# Patient Record
Sex: Male | Born: 1980 | Race: White | Hispanic: No | Marital: Married | Smoking: Never smoker
Health system: Southern US, Community
[De-identification: ages and names within clinical notes are randomized; demographics above are authoritative.]

## PROBLEM LIST (undated history)

## (undated) DIAGNOSIS — E119 Type 2 diabetes mellitus without complications: Secondary | ICD-10-CM

## (undated) DIAGNOSIS — J45909 Unspecified asthma, uncomplicated: Secondary | ICD-10-CM

## (undated) HISTORY — PX: ESOPHAGOGASTRODUODENOSCOPY: SHX1529

## (undated) HISTORY — PX: INCISION AND DRAINAGE ABSCESS ANAL: SUR669

## (undated) HISTORY — PX: TONSILLECTOMY AND ADENOIDECTOMY: SHX28

## (undated) HISTORY — PX: TONSILLECTOMY: SUR1361

## (undated) HISTORY — DX: Type 2 diabetes mellitus without complications: E11.9

---

## 2009-04-29 ENCOUNTER — Observation Stay (HOSPITAL_COMMUNITY): Admission: EM | Admit: 2009-04-29 | Discharge: 2009-04-29 | Payer: Self-pay | Admitting: Emergency Medicine

## 2011-05-14 NOTE — Op Note (Signed)
NAME:  Jimmy Hancock, Jimmy Hancock NO.:  [id number]   MEDICAL RECORD NO.:  [id number]          PATIENT TYPE:  OBV   LOCATION:  1825                         FACILITY:  [facility]   PHYSICIAN:  Graylin Shiver, M.D.   DATE OF BIRTH:  August 01, 1981   DATE OF PROCEDURE:  04/29/2009  DATE OF DISCHARGE:  04/29/2009                               OPERATIVE REPORT   PROCEDURE:  Esophagogastroduodenoscopy with foreign body (meat impaction  removed from the esophagus).   INDICATIONS FOR PROCEDURE:  The patient is a 30 year old male who is an  unassigned patient who presented to the emergency room at [facility] with complaints of being unable to swallow.  He had been eating  stake earlier in the day and has been unable to swallow since he ate the  stake.   We were on unassigned call for [facility] and therefore we were called.   Informed consent was obtained after explanation of the risks of  bleeding, infection, and perforation.   PREMEDICATIONS:  Fentanyl 50 mcg IV and Versed 8 mg IV.   PROCEDURE:  With the patient in the left lateral decubitus position, the  Pentax gastroscope was inserted into the oropharynx and passed into the  esophagus.  It was advanced down the esophagus to the level of the  esophagogastric junction where a round meat impaction was visible.  This  meat impaction was grasped with a [employer] and removed.  After I removed  this, I reinserted the scope.  There was irritation around the level of  the esophagogastric junction where this meat impaction had been stuck,  but no definite stricture.  The scope was advanced down into the stomach  and into the duodenum, which looked normal.  He tolerated this procedure  well without complications.   IMPRESSION:  Meat impaction in the distal esophagus, which was removed.   The patient was returned to the recovery area.  He was instructed to  chew his food well and to avoid chunks of meat.  He is instructed to  stay on  liquids and soft foods for a day and see how he did.           ______________________________  Graylin Shiver, M.D.     SFG/MEDQ  D:  04/29/2009  T:  04/30/2009  Job:  [id number]

## 2017-09-10 ENCOUNTER — Encounter (HOSPITAL_COMMUNITY): Payer: Self-pay | Admitting: Emergency Medicine

## 2017-09-10 ENCOUNTER — Emergency Department (HOSPITAL_COMMUNITY)
Admission: EM | Admit: 2017-09-10 | Discharge: 2017-09-10 | Disposition: A | Discharge status: Home / Self Care | Payer: Managed Care, Other (non HMO) | Attending: Emergency Medicine | Admitting: Emergency Medicine

## 2017-09-10 DIAGNOSIS — R1310 Dysphagia, unspecified: Secondary | ICD-10-CM | POA: Diagnosis present

## 2017-09-10 DIAGNOSIS — R1319 Other dysphagia: Secondary | ICD-10-CM | POA: Diagnosis not present

## 2017-09-10 MED ORDER — DIPHENHYDRAMINE HCL 25 MG PO CAPS
25.0000 mg | ORAL_CAPSULE | Freq: Once | ORAL | Status: AC
  Administered 2017-09-10: 25 mg via ORAL
  Filled 2017-09-10: qty 1

## 2017-09-10 NOTE — ED Triage Notes (Signed)
Pt states he has some throat swelling after eating scallops and tuna tonight.

## 2017-09-10 NOTE — ED Provider Notes (Signed)
[facility] DEPT Provider Note   CSN: [id number] Arrival date & time: 09/10/17  0456     History   Chief Complaint Chief Complaint  Patient presents with  . Allergic Reaction    HPI Jimmy Hancock is a 36 y.o. male.  The history is provided by the patient and a significant other.  Allergic Reaction  Presenting symptoms: difficulty swallowing   Presenting symptoms: no rash   Severity:  Mild Relieved by:  Nothing Worsened by:  Nothing Ineffective treatments:  None tried  Pt reports abrupt onset of difficulty swallowing He reports eating rice/tuna/scallops just prior to arrival After eating he felt something was "stuck" in his esophagus No other symptoms No fever/vomiting No cp/sob No rash No abd pain  He also thinks it could be possible food impaction He reports h/o food impaction several yrs ago due to steak and it feels somewhat similar He reports he has ate tuna/scallops/rice before without any issues   PMH -none Past Surgical History:  Procedure Laterality Date  . ESOPHAGOGASTRODUODENOSCOPY         Home Medications    Prior to Admission medications   Medication Sig Start Date End Date Taking? Authorizing Provider  ibuprofen (ADVIL,MOTRIN) 200 MG tablet Take 200 mg by mouth every 6 (six) hours as needed.   Yes [provider]    Family History No family history on file.  Social History Social History  Substance Use Topics  . Smoking status: Never Smoker  . Smokeless tobacco: Never Used  . Alcohol use No     Allergies   Soap   Review of Systems Review of Systems  Constitutional: Negative for fever.  HENT: Positive for trouble swallowing. Negative for voice change.   Respiratory: Negative for shortness of breath.   Gastrointestinal: Negative for vomiting.  Skin: Negative for rash.  All other systems reviewed and are negative.    Physical Exam Updated Vital Signs BP (!) 142/100 (BP Location: Left Arm)   Pulse 87   Temp  98 F (36.7 C) (Oral)   Resp 18   Ht 1.778 m ( )   Wt 83.9 kg (185 lb)   SpO2 98%   BMI 26.54 kg/m   Physical Exam CONSTITUTIONAL: Well developed/well nourished, smiling, well appearing HEAD: Normocephalic/atraumatic EYES: EOMI/PERRL ENMT: Mucous membranes moist, no angioedema.  No stridor.  Normal phonation.  No tongue swelling noted.  Uvula midline, no edema.  He is handling his secretions, no drooling NECK: supple no meningeal signs SPINE/BACK:entire spine nontender CV: S1/S2 noted, no murmurs/rubs/gallops noted LUNGS: Lungs are clear to auscultation bilaterally, no apparent distress ABDOMEN: soft, nontender, no rebound or guarding, bowel sounds noted throughout abdomen GU:no cva tenderness NEURO: Pt is awake/alert/appropriate, moves all extremitiesx4. EXTREMITIES: pulses normal/equal, full ROM SKIN: warm, color normal, no rash PSYCH: no abnormalities of mood noted, alert and oriented to situation   ED Treatments / Results  Labs (all labs ordered are listed, but only abnormal results are displayed) Labs Reviewed - No data to display  EKG  EKG Interpretation None       Radiology No results found.  Procedures Procedures (including critical care time)  Medications Ordered in ED Medications  diphenhydrAMINE (BENADRYL) capsule 25 mg (25 mg Oral Given 09/10/17 0523)     Initial Impression / Assessment and Plan / ED Course  I have reviewed the triage vital signs and the nursing notes.   Pt well appearing At first he thought this was allergic rxn, but he now thinks it  could be mild food impaction H/o steak food impaction that required EGD in 2010 Since he has had this food before and has no other signs of allergic rxn, probable food impaction He would however like to try benadryl Will also give PO challenge for now Will monitor   At time of discharge: Pt improved He reports his swallowing has improved No signs of allergic reaction Suspect mild food  impaction that is resolving Will d/c home   Final Clinical Impressions(s) / ED Diagnoses   Final diagnoses:  Other dysphagia    New Prescriptions New Prescriptions   No medications on file     Zadie RhineWickline, Mackensey Bolte, MD 09/10/17 [id number]

## 2018-10-21 ENCOUNTER — Ambulatory Visit (INDEPENDENT_AMBULATORY_CARE_PROVIDER_SITE_OTHER): Payer: Managed Care, Other (non HMO) | Admitting: Family Medicine

## 2018-10-21 ENCOUNTER — Encounter: Payer: Self-pay | Admitting: Family Medicine

## 2018-10-21 ENCOUNTER — Other Ambulatory Visit: Payer: Self-pay

## 2018-10-21 VITALS — BP 120/78 | HR 82 | Temp 98.2°F | Ht 68.75 in | Wt 189.8 lb

## 2018-10-21 DIAGNOSIS — M25561 Pain in right knee: Secondary | ICD-10-CM

## 2018-10-21 DIAGNOSIS — M19042 Primary osteoarthritis, left hand: Secondary | ICD-10-CM

## 2018-10-21 DIAGNOSIS — Z23 Encounter for immunization: Secondary | ICD-10-CM

## 2018-10-21 DIAGNOSIS — M19041 Primary osteoarthritis, right hand: Secondary | ICD-10-CM | POA: Diagnosis not present

## 2018-10-21 DIAGNOSIS — Z0000 Encounter for general adult medical examination without abnormal findings: Secondary | ICD-10-CM

## 2018-10-21 DIAGNOSIS — J309 Allergic rhinitis, unspecified: Secondary | ICD-10-CM

## 2018-10-21 LAB — COMPREHENSIVE METABOLIC PANEL
ALBUMIN: 5 g/dL (ref 3.5–5.2)
ALT: 60 U/L — ABNORMAL HIGH (ref 0–53)
AST: 30 U/L (ref 0–37)
Alkaline Phosphatase: 111 U/L (ref 39–117)
BUN: 20 mg/dL (ref 6–23)
CALCIUM: 9.7 mg/dL (ref 8.4–10.5)
CHLORIDE: 100 meq/L (ref 96–112)
CO2: 29 mEq/L (ref 19–32)
Creatinine, Ser: 1.46 mg/dL (ref 0.40–1.50)
GFR: 58.1 mL/min — ABNORMAL LOW (ref >60.00)
Glucose, Bld: 120 mg/dL — ABNORMAL HIGH (ref 70–99)
Potassium: 4.2 mEq/L (ref 3.5–5.1)
Sodium: 136 mEq/L (ref 135–145)
Total Bilirubin: 0.6 mg/dL (ref 0.2–1.2)
Total Protein: 7.5 g/dL (ref 6.0–8.3)

## 2018-10-21 LAB — CBC WITH DIFFERENTIAL/PLATELET
BASOS PCT: 0.9 % (ref 0.0–3.0)
Basophils Absolute: 0.1 10*3/uL (ref 0.0–0.1)
Eosinophils Absolute: 0.3 10*3/uL (ref 0.0–0.7)
Eosinophils Relative: 5.3 % — ABNORMAL HIGH (ref 0.0–5.0)
HCT: 47.3 % (ref 39.0–52.0)
HEMOGLOBIN: 16.5 g/dL (ref 13.0–17.0)
LYMPHS PCT: 20.5 % (ref 12.0–46.0)
Lymphs Abs: 1.3 10*3/uL (ref 0.7–4.0)
MCHC: 34.8 g/dL (ref 30.0–36.0)
MCV: 87.3 fl (ref 78.0–100.0)
MONO ABS: 0.5 10*3/uL (ref 0.1–1.0)
Monocytes Relative: 7.3 % (ref 3.0–12.0)
NEUTROS ABS: 4.1 10*3/uL (ref 1.4–7.7)
Neutrophils Relative %: 66 % (ref 43.0–77.0)
PLATELETS: 271 10*3/uL (ref 150.0–400.0)
RBC: 5.41 Mil/uL (ref 4.22–5.81)
RDW: 12.6 % (ref 11.5–15.5)
WBC: 6.2 10*3/uL (ref 4.0–10.5)

## 2018-10-21 LAB — LDL CHOLESTEROL, DIRECT: LDL DIRECT: 155 mg/dL

## 2018-10-21 LAB — LIPID PANEL
CHOL/HDL RATIO: 6
CHOLESTEROL: 217 mg/dL — AB (ref 0–200)
HDL: 33.7 mg/dL — AB (ref >39.00)
NONHDL: 183.28
TRIGLYCERIDES: 307 mg/dL — AB (ref 0.0–149.0)
VLDL: 61.4 mg/dL — AB (ref 0.0–40.0)

## 2018-10-21 MED ORDER — MOMETASONE FUROATE 50 MCG/ACT NA SUSP: 2.0000 | Freq: Every day | NASAL | 12 refills | Status: DC

## 2018-10-21 NOTE — Addendum Note (Signed)
Addended byDene Gentry on: 10/21/2018 09:51 AM   Modules accepted: Orders

## 2018-10-21 NOTE — Patient Instructions (Addendum)
Please return in 12 months for your annual complete physical; please come fasting. Sooner if your knee or hand pain recur.   Start allergy medications: Allegra   It was a pleasure meeting you today! Thank you for choosing [place] to meet your healthcare needs! I truly look forward to working with you. If you have any questions or concerns, please send me a message via Mychart or call the office at [phone].  Allergic Rhinitis, Adult Allergic rhinitis is an allergic reaction that affects the mucous membrane inside the nose. It causes sneezing, a runny or stuffy nose, and the feeling of mucus going down the back of the throat (postnasal drip). Allergic rhinitis can be mild to severe. There are two types of allergic rhinitis:  Seasonal. This type is also called hay fever. It happens only during certain seasons.  Perennial. This type can happen at any time of the year.  What are the causes? This condition happens when the body's defense system (immune system) responds to certain harmless substances called allergens as though they were germs.  Seasonal allergic rhinitis is triggered by pollen, which can come from grasses, trees, and weeds. Perennial allergic rhinitis may be caused by:  House dust mites.  Pet dander.  Mold spores.  What are the signs or symptoms? Symptoms of this condition include:  Sneezing.  Runny or stuffy nose (nasal congestion).  Postnasal drip.  Itchy nose.  Tearing of the eyes.  Trouble sleeping.  Daytime sleepiness.  How is this diagnosed? This condition may be diagnosed based on:  Your medical history.  A physical exam.  Tests to check for related conditions, such as: ? Asthma. ? Pink eye. ? Ear infection. ? Upper respiratory infection.  Tests to find out which allergens trigger your symptoms. These may include skin or blood tests.  How is this treated? There is no cure for this condition, but treatment can help control symptoms. Treatment  may include:  Taking medicines that block allergy symptoms, such as antihistamines. Medicine may be given as a shot, nasal spray, or pill.  Avoiding the allergen.  Desensitization. This treatment involves getting ongoing shots until your body becomes less sensitive to the allergen. This treatment may be done if other treatments do not help.  If taking medicine and avoiding the allergen does not work, new, stronger medicines may be prescribed.  Follow these instructions at home:  Find out what you are allergic to. Common allergens include smoke, dust, and pollen.  Avoid the things you are allergic to. These are some things you can do to help avoid allergens: ? Replace carpet with wood, tile, or vinyl flooring. Carpet can trap dander and dust. ? Do not smoke. Do not allow smoking in your home. ? Change your heating and air conditioning filter at least once a month. ? During allergy season:  Keep windows closed as much as possible.  Plan outdoor activities when pollen counts are lowest. This is usually during the evening hours.  When coming indoors, change clothing and shower before sitting on furniture or bedding.  Take over-the-counter and prescription medicines only as told by your health care provider.  Keep all follow-up visits as told by your health care provider. This is important. Contact a health care provider if:  You have a fever.  You develop a persistent cough.  You make whistling sounds when you breathe (you wheeze).  Your symptoms interfere with your normal daily activities. Get help right away if:  You have shortness of breath.  Summary  This condition can be managed by taking medicines as directed and avoiding allergens.  Contact your health care provider if you develop a persistent cough or fever.  During allergy season, keep windows closed as much as possible. This information is not intended to replace advice given to you by your health care provider.  Make sure you discuss any questions you have with your health care provider. Document Released: 09/10/2001 Document Revised: 01/23/2017 Document Reviewed: 01/23/2017 Elsevier Interactive Patient Education  [employer].

## 2018-10-21 NOTE — Progress Notes (Signed)
Subjective  CC:  Chief Complaint  Patient presents with  . Establish Care    Transfer from [facility], last physcial 3-4 years ago   . Knee Problem    on and off pain, more when working   . Cough    Patient states that he has have drainage, where he has to cough up Phelgm, 3-4 times per day     HPI: Jimmy Hancock is a 37 y.o. male who presents to [facility] at [facility] today to establish care with me as a new patient.   He has the following concerns or needs:  2-3 week history of right knee pain. Now resolved spontaneously. Ibuprofen helped. No injury. No h/o knee problems.   Hand pain: joints w/o redness or swelling. Works as Conservation officer, nature. No injuries. Comes and goes.   2 years of pnd and clearing throat. No cough. No sob. Nonsmoker. Has failed flonase, patanase and zyrtec.   HM: due labs and imms  Assessment  1. Annual physical exam   2. Primary osteoarthritis of both hands   3. Acute pain of right knee   4. Chronic allergic rhinitis      Plan   Wellness Visit:  Age appropriate Health Maintenance and Prevention measures were discussed with patient. Included topics are cancer screening recommendations, ways to keep healthy (see AVS) including dietary and exercise recommendations, regular eye and dental care, use of seat belts, and avoidance of moderate alcohol use and tobacco use.   BMI: discussed patient's BMI and encouraged positive lifestyle modifications to help get to or maintain a target BMI.  HM needs and immunizations were addressed and ordered. See below for orders. See HM and immunization section for updates.tdap and flu today  Routine labs and screening tests ordered including cmp, cbc and lipids where appropriate.  Discussed recommendations regarding Vit D and calcium supplementation (see AVS)  Hand and knee pain: ? Early OA vs tendonitis in hands. Tylenol or advil as needed. Since not currently active, no further eval  today. F/u here if worsens.   Allergies: start meds.     Follow up:  Return in about 1 year (around 10/22/2019) for complete physical. Orders Placed This Encounter  Procedures  . CBC with Differential/Platelet  . Comprehensive metabolic panel  . Lipid panel  . HIV Antibody (routine testing w rflx)   Meds ordered this encounter  Medications  . mometasone (NASONEX) 50 MCG/ACT nasal spray    Sig: Place 2 sprays into the nose daily.    Dispense:  17 g    Refill:  12     Depression screen PHQ 2/9 10/21/2018  Decreased Interest 0  Down, Depressed, Hopeless 0  PHQ - 2 Score 0    We updated and reviewed the patient's past history in detail and it is documented below.  Patient Active Problem List   Diagnosis Date Noted  . Annual physical exam 10/21/2018  . Chronic allergic rhinitis 10/21/2018  . Primary osteoarthritis of both hands 10/21/2018   Health Maintenance  Topic Date Due  . HIV Screening  11/29/1997  . TETANUS/TDAP  11/29/2001  . INFLUENZA VACCINE  07/30/2018    There is no immunization history on file for this patient. No outpatient medications have been marked as taking for the 10/21/18 encounter (Office Visit) with Willow Ora, MD.    Allergies: Patient is allergic to soap. Past Medical History Patient  has no past medical history on file. Past Surgical History  Patient  has a past surgical history that includes Esophagogastroduodenoscopy and Tonsillectomy and adenoidectomy. Family History: Patient family history is not on file. Social History:  Patient  reports that he has never smoked. He has never used smokeless tobacco. He reports that he drinks alcohol. He reports that he does not use drugs.  Review of Systems: Constitutional: negative for fever or malaise Ophthalmic: negative for photophobia, double vision or loss of vision Cardiovascular: negative for chest pain, dyspnea on exertion, or new LE swelling Respiratory: negative for SOB or  persistent cough Gastrointestinal: negative for abdominal pain, change in bowel habits or melena Genitourinary: negative for dysuria or gross hematuria Musculoskeletal: negative for new gait disturbance or muscular weakness Integumentary: negative for new or persistent rashes Neurological: negative for TIA or stroke symptoms Psychiatric: negative for SI or delusions Allergic/Immunologic: negative for hives  Patient Care Team    Relationship Specialty Notifications Start End  Willow Ora, MD PCP - General Family Medicine  10/21/18     Objective  Vitals: BP 120/78   Pulse 82   Temp 98.2 F (36.8 C)   Ht 5' 8.75" (1.746 m)   Wt 189 lb 12.8 oz (86.1 kg)   BMI 28.23 kg/m  General:  Well developed, well nourished, no acute distress , clears throat often Psych:  Alert and oriented,normal mood and affect HEENT:  Normocephalic, atraumatic, non-icteric sclera, PERRL,inflamed nasal mucosa, nl TMs, oropharynx is without mass or exudate, supple neck without adenopathy, mass or thyromegaly Cardiovascular:  RRR without gallop, rub or murmur, nondisplaced PMI Respiratory:  Good breath sounds bilaterally, CTAB with normal respiratory effort Gastrointestinal: normal bowel sounds, soft, non-tender, no noted masses. No HSM MSK: no deformities, contusions. Joints are without erythema or swelling, nontender joints, nl bilateral knee exam, popping with deep squat. No crepitus.  Skin:  Warm, no rashes or suspicious lesions noted Neurologic:    Mental status is normal. Gross motor and sensory exams are normal. Normal gait   Commons side effects, risks, benefits, and alternatives for medications and treatment plan prescribed today were discussed, and the patient expressed understanding of the given instructions. Patient is instructed to call or message via MyChart if he/she has any questions or concerns regarding our treatment plan. No barriers to understanding were identified. We discussed Red Flag  symptoms and signs in detail. Patient expressed understanding regarding what to do in case of urgent or emergency type symptoms.   Medication list was reconciled, printed and provided to the patient in AVS. Patient instructions and summary information was reviewed with the patient as documented in the AVS. This note was prepared with assistance of Dragon voice recognition software. Occasional wrong-word or sound-a-like substitutions may have occurred due to the inherent limitations of voice recognition software

## 2018-10-22 ENCOUNTER — Other Ambulatory Visit (INDEPENDENT_AMBULATORY_CARE_PROVIDER_SITE_OTHER): Payer: Managed Care, Other (non HMO)

## 2018-10-22 DIAGNOSIS — R7309 Other abnormal glucose: Secondary | ICD-10-CM

## 2018-10-22 LAB — HEMOGLOBIN A1C: Hgb A1c MFr Bld: 6.2 % (ref 4.6–6.5)

## 2018-10-22 LAB — HIV ANTIBODY (ROUTINE TESTING W REFLEX): HIV 1&2 Ab, 4th Generation: NONREACTIVE

## 2018-10-22 NOTE — Progress Notes (Signed)
Please add on A1c, dx: hyperglycemia (nonfasting) Thanks, Dr. Mardelle Matte '

## 2019-01-25 ENCOUNTER — Ambulatory Visit: Payer: Managed Care, Other (non HMO) | Admitting: Family Medicine

## 2019-10-25 ENCOUNTER — Encounter: Payer: Managed Care, Other (non HMO) | Admitting: Family Medicine

## 2020-04-23 ENCOUNTER — Encounter: Payer: Self-pay | Admitting: Emergency Medicine

## 2020-04-23 ENCOUNTER — Other Ambulatory Visit: Payer: Self-pay

## 2020-04-23 ENCOUNTER — Ambulatory Visit
Admission: EM | Admit: 2020-04-23 | Discharge: 2020-04-23 | Disposition: A | Discharge status: Home / Self Care | Payer: Managed Care, Other (non HMO) | Attending: Emergency Medicine | Admitting: Emergency Medicine

## 2020-04-23 DIAGNOSIS — L0231 Cutaneous abscess of buttock: Secondary | ICD-10-CM | POA: Diagnosis not present

## 2020-04-23 HISTORY — DX: Unspecified asthma, uncomplicated: J45.909

## 2020-04-23 MED ORDER — CEPHALEXIN 500 MG PO CAPS: 500.0000 mg | ORAL_CAPSULE | Freq: Four times a day (QID) | ORAL | 0 refills | Status: DC

## 2020-04-23 NOTE — Discharge Instructions (Addendum)
Apply warm compresses 3-4x daily for 10-15 minutes Wash site daily with warm water and mild soap Take antibiotic as prescribed and to completion Follow up here or with PCP if symptoms persists Return or go to the ED if you have any new or worsening symptoms increased redness, swelling, pain, nausea, vomiting, fever, chills, etc...  

## 2020-04-23 NOTE — ED Provider Notes (Signed)
[facility]   [id number] 04/23/20 Arrival Time: 5956   LO:VFIEPPI  SUBJECTIVE:  Jimmy Hancock is a 38 y.o. male who presents presented to the urgent care with a complaint of abscess on his right buttocks for a few days.  Denies drainage at this time the reported redness.  Denies any precipitating event.  Has not used any medication.  Nothing make his symptoms worse.  Denies chills, fever, nausea, vomiting, diarrhea, chest pain, chest tightness, confusion.  ROS: As per HPI.  All other pertinent ROS negative.     Past Medical History:  Diagnosis Date  . Asthma    Past Surgical History:  Procedure Laterality Date  . ESOPHAGOGASTRODUODENOSCOPY    . TONSILLECTOMY AND ADENOIDECTOMY     Allergies  Allergen Reactions  . Soap    No current facility-administered medications on file prior to encounter.   Current Outpatient Medications on File Prior to Encounter  Medication Sig Dispense Refill  . mometasone (NASONEX) 50 MCG/ACT nasal spray Place 2 sprays into the nose daily. 17 g 12   Social History   Socioeconomic History  . Marital status: Married    Spouse name: Not on file  . Number of children: 0  . Years of education: Not on file  . Highest education level: Not on file  Occupational History  . Occupation: Scientist, water quality, night shift    Employer: [employer]  Tobacco Use  . Smoking status: Never Smoker  . Smokeless tobacco: Never Used  Substance and Sexual Activity  . Alcohol use: Yes    Comment: occasionally   . Drug use: No  . Sexual activity: Yes  Other Topics Concern  . Not on file  Social History Narrative  . Not on file   Social Determinants of Health   Financial Resource Strain:   . Difficulty of Paying Living Expenses:   Food Insecurity:   . Worried About Charity fundraiser in the Last Year:   . Arboriculturist in the Last Year:   Transportation Needs:   . Film/video editor (Medical):   Marland Kitchen Lack of Transportation (Non-Medical):     Physical Activity:   . Days of Exercise per Week:   . Minutes of Exercise per Session:   Stress:   . Feeling of Stress :   Social Connections:   . Frequency of Communication with Friends and Family:   . Frequency of Social Gatherings with Friends and Family:   . Attends Religious Services:   . Active Member of Clubs or Organizations:   . Attends Archivist Meetings:   Marland Kitchen Marital Status:   Intimate Partner Violence:   . Fear of Current or Ex-Partner:   . Emotionally Abused:   Marland Kitchen Physically Abused:   . Sexually Abused:    Family History  Problem Relation Age of Onset  . Arthritis Mother   . Hearing loss Maternal Grandfather   . Heart attack Maternal Grandfather     OBJECTIVE:  Vitals:   04/23/20 0847  BP: (!) 173/98  Pulse: (!) 106  Resp: 18  Temp: 98.7 F (37.1 C)  TempSrc: Oral  SpO2: 98%     Physical Exam Vitals reviewed.  Constitutional:      General: He is not in acute distress.    Appearance: Normal appearance. He is normal weight. He is not ill-appearing, toxic-appearing or diaphoretic.  Cardiovascular:     Rate and Rhythm: Normal rate and regular rhythm.     Pulses: Normal pulses.  Heart sounds: Normal heart sounds. No murmur. No friction rub. No gallop.   Pulmonary:     Effort: Pulmonary effort is normal. No respiratory distress.     Breath sounds: Normal breath sounds. No stridor. No wheezing, rhonchi or rales.  Chest:     Chest wall: No tenderness.  Skin:    General: Skin is warm.     Findings: Erythema present. No rash.     Comments: Abscess with 0.5 cm induration, hard and tender to touch.  Neurological:     Mental Status: He is alert.     Procedure: Patient requesting conservative measures first.  I&D was not completed at this visit.  ASSESSMENT & PLAN:  1. Abscess of buttock     Meds ordered this encounter  Medications  . cephALEXin (KEFLEX) 500 MG capsule    Sig: Take 1 capsule (500 mg total) by mouth 4 (four) times  daily.    Dispense:  20 capsule    Refill:  0    Discharge instruction Apply warm compresses 3-4x daily for 10-15 minutes Wash site daily with warm water and mild soap Take antibiotic as prescribed and to completion Follow up here or with PCP if symptoms persists Return or go to the ED if you have any new or worsening symptoms increased redness, swelling, pain, nausea, vomiting, fever, chills, etc...    Reviewed expectations re: course of current medical issues. Questions answered. Outlined signs and symptoms indicating need for more acute intervention. Patient verbalized understanding. After Visit Summary given.          Durward Parcel, FNP 04/23/20 [phone]

## 2020-04-23 NOTE — ED Triage Notes (Signed)
Pt here for abscess to right buttocks with hx of same in past; pt denies drainage

## 2020-04-27 ENCOUNTER — Encounter: Payer: Self-pay | Admitting: Emergency Medicine

## 2020-04-27 ENCOUNTER — Ambulatory Visit
Admission: EM | Admit: 2020-04-27 | Discharge: 2020-04-27 | Disposition: A | Discharge status: Home / Self Care | Payer: Managed Care, Other (non HMO) | Attending: Emergency Medicine | Admitting: Emergency Medicine

## 2020-04-27 ENCOUNTER — Other Ambulatory Visit: Payer: Self-pay

## 2020-04-27 DIAGNOSIS — L0231 Cutaneous abscess of buttock: Secondary | ICD-10-CM | POA: Diagnosis not present

## 2020-04-27 MED ORDER — DOXYCYCLINE HYCLATE 100 MG PO CAPS: 100.0000 mg | ORAL_CAPSULE | Freq: Two times a day (BID) | ORAL | 0 refills | Status: DC

## 2020-04-27 NOTE — ED Triage Notes (Addendum)
patient was seen 04/23/2020 for abscess on right buttocks.  Patient reports this is not feeling any better, wife reports its larger, patient thinks it is softer

## 2020-04-27 NOTE — ED Provider Notes (Addendum)
[facility]   [id number] 04/27/20 Arrival Time: 0823   [id number]  SUBJECTIVE:  Jimmy Hancock is a 39 y.o. male who presented to the urgent care with a complaint of abscess on his right buttocks for a few days.  Denies drainage at this time but report redness.  Was seen on 04/23/2020 and was prescribed Keflex.  Has completed the antibiotic course with no relief.  Report symptom is worse when trying to sit down.  Denies chills, fever, nausea, vomiting, diarrhea. ROS: As per HPI.  All other pertinent ROS negative.     Past Medical History:  Diagnosis Date  . Asthma    Past Surgical History:  Procedure Laterality Date  . ESOPHAGOGASTRODUODENOSCOPY    . TONSILLECTOMY    . TONSILLECTOMY AND ADENOIDECTOMY     Allergies  Allergen Reactions  . Soap    No current facility-administered medications on file prior to encounter.   Current Outpatient Medications on File Prior to Encounter  Medication Sig Dispense Refill  . cephALEXin (KEFLEX) 500 MG capsule Take 1 capsule (500 mg total) by mouth 4 (four) times daily. 20 capsule 0  . mometasone (NASONEX) 50 MCG/ACT nasal spray Place 2 sprays into the nose daily. 17 g 12   Social History   Socioeconomic History  . Marital status: Married    Spouse name: Not on file  . Number of children: 0  . Years of education: Not on file  . Highest education level: Not on file  Occupational History  . Occupation: Scientist, water quality, night shift    Employer: [employer]  Tobacco Use  . Smoking status: Never Smoker  . Smokeless tobacco: Never Used  Substance and Sexual Activity  . Alcohol use: Yes    Comment: occasionally   . Drug use: No  . Sexual activity: Yes  Other Topics Concern  . Not on file  Social History Narrative  . Not on file   Social Determinants of Health   Financial Resource Strain:   . Difficulty of Paying Living Expenses:   Food Insecurity:   . Worried About Charity fundraiser in the Last Year:   . Youth worker in the Last Year:   Transportation Needs:   . Film/video editor (Medical):   Marland Kitchen Lack of Transportation (Non-Medical):   Physical Activity:   . Days of Exercise per Week:   . Minutes of Exercise per Session:   Stress:   . Feeling of Stress :   Social Connections:   . Frequency of Communication with Friends and Family:   . Frequency of Social Gatherings with Friends and Family:   . Attends Religious Services:   . Active Member of Clubs or Organizations:   . Attends Archivist Meetings:   Marland Kitchen Marital Status:   Intimate Partner Violence:   . Fear of Current or Ex-Partner:   . Emotionally Abused:   Marland Kitchen Physically Abused:   . Sexually Abused:    Family History  Problem Relation Age of Onset  . Arthritis Mother   . Hearing loss Maternal Grandfather   . Heart attack Maternal Grandfather     OBJECTIVE:  Vitals:   04/27/20 0833  BP: (!) 148/102  Pulse: (!) 103  Resp: 20  Temp: 99.1 F (37.3 C)  TempSrc: Oral  SpO2: 96%    Physical Exam Vitals and nursing note reviewed.  Constitutional:      General: He is not in acute distress.    Appearance: Normal appearance.  He is normal weight. He is not ill-appearing, toxic-appearing or diaphoretic.  Cardiovascular:     Rate and Rhythm: Regular rhythm. Tachycardia present.     Pulses: Normal pulses.     Heart sounds: No murmur. No friction rub. No gallop.   Pulmonary:     Effort: Pulmonary effort is normal. No respiratory distress.     Breath sounds: Normal breath sounds. No stridor. No wheezing or rales.  Chest:     Chest wall: No tenderness.  Skin:    General: Skin is warm.     Findings: Abscess and erythema present.     Comments: Abscess of right buttocks Abscess induration approximate 2 cm, erythema present.  Neurological:     Mental Status: He is alert.     Procedure: Verbal consent obtained. Area over induration cleaned with betadine. Lidocaine 2% with epinephrine used to obtain local anesthesia.  The most fluctuant portion of the abscess was incised with a #11 blade scalpel. Abscess cavity explored and evacuated. Loculations broken up with a curved hemostat as best as possible given patient discomfort. Cavity carry left open for drainage and dressed with a clean gauze dressing. Minimal bleeding. No complications.  ASSESSMENT & PLAN:  1. Abscess of right buttock     Meds ordered this encounter  Medications  . doxycycline (VIBRAMYCIN) 100 MG capsule    Sig: Take 1 capsule (100 mg total) by mouth 2 (two) times daily.    Dispense:  20 capsule    Refill:  0     Discharge instruction Keep dry and covered for next 24-48 hours May wash site daily with warm water and mild soap Keep covered to avoid friction Take antibiotic as prescribed and to completion Return sooner or go to the ED if you have any new or worsening symptoms such as increased redness, swelling, pain, nausea, vomiting, fever, chills, etc...   Reviewed expectations re: course of current medical issues. Questions answered. Outlined signs and symptoms indicating need for more acute intervention. Patient verbalized understanding. After Visit Summary given.       Durward Parcel, FNP 04/27/20 [phone]

## 2020-04-27 NOTE — Discharge Instructions (Signed)
Discharge instruction Keep dry and covered for next 24-48 hours May wash site daily with warm water and mild soap Keep covered to avoid friction Take antibiotic as prescribed and to completion Return sooner or go to the ED if you have any new or worsening symptoms such as increased redness, swelling, pain, nausea, vomiting, fever, chills, etc..Marland Kitchen

## 2020-09-22 ENCOUNTER — Other Ambulatory Visit: Payer: Self-pay

## 2020-09-22 ENCOUNTER — Ambulatory Visit
Admission: EM | Admit: 2020-09-22 | Discharge: 2020-09-22 | Disposition: A | Discharge status: Home / Self Care | Payer: Managed Care, Other (non HMO) | Attending: Emergency Medicine | Admitting: Emergency Medicine

## 2020-09-22 ENCOUNTER — Encounter: Payer: Self-pay | Admitting: Emergency Medicine

## 2020-09-22 DIAGNOSIS — J069 Acute upper respiratory infection, unspecified: Secondary | ICD-10-CM | POA: Diagnosis not present

## 2020-09-22 DIAGNOSIS — Z1152 Encounter for screening for COVID-19: Secondary | ICD-10-CM | POA: Diagnosis not present

## 2020-09-22 MED ORDER — CETIRIZINE HCL 10 MG PO TABS: 10.0000 mg | ORAL_TABLET | Freq: Every day | ORAL | 0 refills | Status: DC

## 2020-09-22 MED ORDER — FLUTICASONE PROPIONATE 50 MCG/ACT NA SUSP: 1.0000 | Freq: Every day | NASAL | 0 refills | Status: DC

## 2020-09-22 MED ORDER — BENZONATATE 100 MG PO CAPS: 100.0000 mg | ORAL_CAPSULE | Freq: Three times a day (TID) | ORAL | 0 refills | Status: DC

## 2020-09-22 MED ORDER — DEXAMETHASONE 4 MG PO TABS: 4.0000 mg | ORAL_TABLET | Freq: Every day | ORAL | 0 refills | Status: AC

## 2020-09-22 NOTE — ED Triage Notes (Addendum)
Cough and congestion that started yesterday  

## 2020-09-22 NOTE — ED Provider Notes (Signed)
[facility]   [id number] 09/22/20 Arrival Time: 6010   CC: COVID symptoms  SUBJECTIVE: History from: patient.  Jimmy Hancock is a 39 y.o. male who presents to the urgent care for complaint of cough, congestion that started yesterday.  Denies sick exposure to COVID, flu or strep.  Denies recent travel.  Has tried OTC medication without relief.  Denies aggravating factors.  Denies previous symptoms in the past.   Denies fever, chills, fatigue, sinus pain, rhinorrhea, sore throat, SOB, wheezing, chest pain, nausea, changes in bowel or bladder habits.     ROS: As per HPI.  All other pertinent ROS negative.     Past Medical History:  Diagnosis Date  . Asthma    Past Surgical History:  Procedure Laterality Date  . ESOPHAGOGASTRODUODENOSCOPY    . TONSILLECTOMY    . TONSILLECTOMY AND ADENOIDECTOMY     Allergies  Allergen Reactions  . Soap    No current facility-administered medications on file prior to encounter.   Current Outpatient Medications on File Prior to Encounter  Medication Sig Dispense Refill  . cephALEXin (KEFLEX) 500 MG capsule Take 1 capsule (500 mg total) by mouth 4 (four) times daily. 20 capsule 0  . doxycycline (VIBRAMYCIN) 100 MG capsule Take 1 capsule (100 mg total) by mouth 2 (two) times daily. 20 capsule 0  . mometasone (NASONEX) 50 MCG/ACT nasal spray Place 2 sprays into the nose daily. 17 g 12   Social History   Socioeconomic History  . Marital status: Married    Spouse name: Not on file  . Number of children: 0  . Years of education: Not on file  . Highest education level: Not on file  Occupational History  . Occupation: Conservation officer, nature, night shift    Employer: [employer]  Tobacco Use  . Smoking status: Never Smoker  . Smokeless tobacco: Never Used  Vaping Use  . Vaping Use: Never used  Substance and Sexual Activity  . Alcohol use: Yes    Comment: occasionally   . Drug use: No  . Sexual activity: Yes  Other Topics Concern  .  Not on file  Social History Narrative  . Not on file   Social Determinants of Health   Financial Resource Strain:   . Difficulty of Paying Living Expenses: Not on file  Food Insecurity:   . Worried About Programme researcher, broadcasting/film/video in the Last Year: Not on file  . Ran Out of Food in the Last Year: Not on file  Transportation Needs:   . Lack of Transportation (Medical): Not on file  . Lack of Transportation (Non-Medical): Not on file  Physical Activity:   . Days of Exercise per Week: Not on file  . Minutes of Exercise per Session: Not on file  Stress:   . Feeling of Stress : Not on file  Social Connections:   . Frequency of Communication with Friends and Family: Not on file  . Frequency of Social Gatherings with Friends and Family: Not on file  . Attends Religious Services: Not on file  . Active Member of Clubs or Organizations: Not on file  . Attends Banker Meetings: Not on file  . Marital Status: Not on file  Intimate Partner Violence:   . Fear of Current or Ex-Partner: Not on file  . Emotionally Abused: Not on file  . Physically Abused: Not on file  . Sexually Abused: Not on file   Family History  Problem Relation Age of Onset  .  Arthritis Mother   . Hearing loss Maternal Grandfather   . Heart attack Maternal Grandfather     OBJECTIVE:  Vitals:   09/22/20 0825 09/22/20 0831  BP:  133/90  Pulse:  100  Resp:  18  Temp: 97.9 F (36.6 C) 98.7 F (37.1 C)  TempSrc: Oral Oral  SpO2:  97%  Weight:  190 lb (86.2 kg)  Height:  5\' 10"  (1.778 m)     General appearance: alert; appears fatigued, but nontoxic; speaking in full sentences and tolerating own secretions HEENT: NCAT; Ears: EACs clear, TMs pearly gray; Eyes: PERRL.  EOM grossly intact. Sinuses: nontender; Nose: nares patent without rhinorrhea, Throat: oropharynx clear, tonsils non erythematous or enlarged, uvula midline  Neck: supple without LAD Lungs: unlabored respirations, symmetrical air entry;  cough: mild; no respiratory distress; CTAB Heart: regular rate and rhythm.  Radial pulses 2+ symmetrical bilaterally Skin: warm and dry Psychological: alert and cooperative; normal mood and affect  LABS:  No results found for this or any previous visit (from the past 24 hour(s)).   ASSESSMENT & PLAN:  1. URI with cough and congestion   2. Encounter for screening for COVID-19     Meds ordered this encounter  Medications  . fluticasone (FLONASE) 50 MCG/ACT nasal spray    Sig: Place 1 spray into both nostrils daily for 14 days.    Dispense:  16 g    Refill:  0  . benzonatate (TESSALON) 100 MG capsule    Sig: Take 1 capsule (100 mg total) by mouth every 8 (eight) hours.    Dispense:  30 capsule    Refill:  0  . dexamethasone (DECADRON) 4 MG tablet    Sig: Take 1 tablet (4 mg total) by mouth daily for 7 days.    Dispense:  7 tablet    Refill:  0  . cetirizine (ZYRTEC ALLERGY) 10 MG tablet    Sig: Take 1 tablet (10 mg total) by mouth daily.    Dispense:  30 tablet    Refill:  0    Discharge Instructions    COVID testing ordered.  It will take between 2-7 days for test results.  Someone will contact you regarding abnormal results.    In the meantime: You should remain isolated in your home for 10 days from symptom onset AND greater than 72 hours after symptoms resolution (absence of fever without the use of fever-reducing medication and improvement in respiratory symptoms), whichever is longer Get plenty of rest and push fluids Tessalon Perles prescribed for cough Zyrtec for nasal congestion, runny nose, and/or sore throat Flonase for nasal congestion and runny nose Decadron was prescribed Use medications daily for symptom relief Use OTC medications like ibuprofen or tylenol as needed fever or pain Call or go to the ED if you have any new or worsening symptoms such as fever, worsening cough, shortness of breath, chest tightness, chest pain, turning blue, changes in mental  status, etc...   Reviewed expectations re: course of current medical issues. Questions answered. Outlined signs and symptoms indicating need for more acute intervention. Patient verbalized understanding. After Visit Summary given.      Note: This document was prepared using Dragon voice recognition software and may include unintentional dictation errors.    , FNP 09/22/20 [phone]

## 2020-09-22 NOTE — Discharge Instructions (Signed)
COVID testing ordered.  It will take between 2-7 days for test results.  Someone will contact you regarding abnormal results.    In the meantime: You should remain isolated in your home for 10 days from symptom onset AND greater than 72 hours after symptoms resolution (absence of fever without the use of fever-reducing medication and improvement in respiratory symptoms), whichever is longer Get plenty of rest and push fluids Tessalon Perles prescribed for cough Zyrtec for nasal congestion, runny nose, and/or sore throat Flonase for nasal congestion and runny nose Decadron was prescribed Use medications daily for symptom relief Use OTC medications like ibuprofen or tylenol as needed fever or pain Call or go to the ED if you have any new or worsening symptoms such as fever, worsening cough, shortness of breath, chest tightness, chest pain, turning blue, changes in mental status, etc..Marland Kitchen

## 2020-09-23 LAB — SARS-COV-2, NAA 2 DAY TAT

## 2020-09-23 LAB — NOVEL CORONAVIRUS, NAA: SARS-CoV-2, NAA: NOT DETECTED

## 2021-06-14 ENCOUNTER — Ambulatory Visit
Admission: EM | Admit: 2021-06-14 | Discharge: 2021-06-14 | Disposition: A | Discharge status: Home / Self Care | Payer: Managed Care, Other (non HMO) | Attending: Emergency Medicine | Admitting: Emergency Medicine

## 2021-06-14 ENCOUNTER — Encounter: Payer: Self-pay | Admitting: Emergency Medicine

## 2021-06-14 ENCOUNTER — Ambulatory Visit (INDEPENDENT_AMBULATORY_CARE_PROVIDER_SITE_OTHER): Payer: Managed Care, Other (non HMO)

## 2021-06-14 DIAGNOSIS — M79641 Pain in right hand: Secondary | ICD-10-CM

## 2021-06-14 DIAGNOSIS — S6991XA Unspecified injury of right wrist, hand and finger(s), initial encounter: Secondary | ICD-10-CM

## 2021-06-14 MED ORDER — MELOXICAM 15 MG PO TABS: 15.0000 mg | ORAL_TABLET | Freq: Every day | ORAL | 0 refills | Status: DC

## 2021-06-14 NOTE — ED Provider Notes (Signed)
[facility]   [id number] 06/14/21 Arrival Time: 0258  CC:RT hand PAIN  SUBJECTIVE: History from: patient. Jimmy Hancock is a 40 y.o. male complains of RT hand pain and injury that occurred 7-9 days ago.  Hit back of hand on door frame after cat scared him.  Localizes the pain to the back of hand.  Describes the pain as intermittent and sharp in character.  Has tried OTC medications with minimal relief.  Symptoms are made worse with intermittent pressure.  Had bruising and swelling that has mostly improved.  Denies fever, chills, erythema, weakness, numbness and tingling.  ROS: As per HPI.  All other pertinent ROS negative.     Past Medical History:  Diagnosis Date   Asthma    Past Surgical History:  Procedure Laterality Date   ESOPHAGOGASTRODUODENOSCOPY     TONSILLECTOMY     TONSILLECTOMY AND ADENOIDECTOMY     Allergies  Allergen Reactions   Soap    No current facility-administered medications on file prior to encounter.   No current outpatient medications on file prior to encounter.   Social History   Socioeconomic History   Marital status: Married    Spouse name: Not on file   Number of children: 0   Years of education: Not on file   Highest education level: Not on file  Occupational History   Occupation: Conservation officer, nature, night shift    Employer: [employer]  Tobacco Use   Smoking status: Never   Smokeless tobacco: Never  Vaping Use   Vaping Use: Never used  Substance and Sexual Activity   Alcohol use: Yes    Comment: occasionally    Drug use: No   Sexual activity: Yes  Other Topics Concern   Not on file  Social History Narrative   Not on file   Social Determinants of Health   Financial Resource Strain: Not on file  Food Insecurity: Not on file  Transportation Needs: Not on file  Physical Activity: Not on file  Stress: Not on file  Social Connections: Not on file  Intimate Partner Violence: Not on file   Family History  Problem  Relation Age of Onset   Arthritis Mother    Hearing loss Maternal Grandfather    Heart attack Maternal Grandfather     OBJECTIVE:  Vitals:   06/14/21 0822  BP: 117/77  Pulse: 78  Resp: 16  Temp: 98.5 F (36.9 C)  TempSrc: Oral  SpO2: 95%    General appearance: ALERT; in no acute distress.  Head: NCAT Lungs: Normal respiratory effort CV: Radial pulse 2+ Musculoskeletal: RT hand  Inspection: mild overlying swelling and light ecchymosis Palpation: Mildly TTP over fifth MC ROM: FROM active and passive Strength: deferred Skin: warm and dry Neurologic: Ambulates without difficulty; Sensation intact about the upper extremities Psychological: alert and cooperative; normal mood and affect  DIAGNOSTIC STUDIES:  DG Hand Complete Right  Result Date: 06/14/2021 CLINICAL DATA:  Pain and swelling.  Recent trauma EXAM: RIGHT HAND - COMPLETE 3+ VIEW COMPARISON:  None. FINDINGS: Frontal, oblique, and lateral views obtained. No acute fracture or dislocation. Remodeling in the distal fifth metacarpal region is likely indicative of prior trauma with healing. Joint spaces appear normal. No erosive change. IMPRESSION: No acute fracture or dislocation. Evidence of prior trauma with remodeling distal fifth metacarpal. No appreciable arthropathic change. Electronically Signed   By: Bretta Bang III M.D.   On: 06/14/2021 08:40     X-rays negative for bony abnormalities including fracture, or  dislocation.  No soft tissue swelling.    I have reviewed the x-rays myself and the radiologist interpretation. I am in agreement with the radiologist interpretation.     ASSESSMENT & PLAN:  1. Right hand pain   2. Hand injury, right, initial encounter     @NIMG @  Meds ordered this encounter  Medications   meloxicam (MOBIC) 15 MG tablet    Sig: Take 1 tablet (15 mg total) by mouth daily.    Dispense:  20 tablet    Refill:  0    Order Specific Question:   Supervising Provider    Answer:   Eustace Moore   X-rays negative for fracture or dislocation, but shows evidence of trauma Splint applied Continue conservative management of rest, ice, and elevation Take mobic as needed for pain relief (may cause abdominal discomfort, ulcers, and GI bleeds avoid taking with other NSAIDs) Follow up with PCP if symptoms persist Return or go to the ER if you have any new or worsening symptoms (fever, chills, chest pain, redness, swelling, bruising, deformity, etc...)   Reviewed expectations re: course of current medical issues. Questions answered. Outlined signs and symptoms indicating need for more acute intervention. Patient verbalized understanding. After Visit Summary given.     [[id number]], PA-C 06/14/21 [phone]

## 2021-06-14 NOTE — Discharge Instructions (Addendum)
X-rays negative for fracture or dislocation, but shows evidence of trauma Splint applied Continue conservative management of rest, ice, and elevation Take mobic as needed for pain relief (may cause abdominal discomfort, ulcers, and GI bleeds avoid taking with other NSAIDs) Follow up with PCP if symptoms persist Return or go to the ER if you have any new or worsening symptoms (fever, chills, chest pain, redness, swelling, bruising, deformity, etc...)

## 2021-06-14 NOTE — ED Triage Notes (Signed)
Right hand pain, hit hand on door frame 7 days ago.  Bruising noted to top of right hand.  States hand hurts more today.

## 2021-07-08 ENCOUNTER — Ambulatory Visit
Admission: EM | Admit: 2021-07-08 | Discharge: 2021-07-08 | Disposition: A | Discharge status: Home / Self Care | Payer: Managed Care, Other (non HMO) | Attending: Urgent Care | Admitting: Urgent Care

## 2021-07-08 ENCOUNTER — Other Ambulatory Visit: Payer: Self-pay

## 2021-07-08 ENCOUNTER — Encounter: Payer: Self-pay | Admitting: Emergency Medicine

## 2021-07-08 DIAGNOSIS — J4520 Mild intermittent asthma, uncomplicated: Secondary | ICD-10-CM | POA: Diagnosis not present

## 2021-07-08 DIAGNOSIS — R059 Cough, unspecified: Secondary | ICD-10-CM

## 2021-07-08 DIAGNOSIS — R0789 Other chest pain: Secondary | ICD-10-CM

## 2021-07-08 MED ORDER — PROMETHAZINE-DM 6.25-15 MG/5ML PO SYRP: 5.0000 mL | ORAL_SOLUTION | Freq: Every evening | ORAL | 0 refills | Status: DC | PRN

## 2021-07-08 MED ORDER — ALBUTEROL SULFATE HFA 108 (90 BASE) MCG/ACT IN AERS: 1.0000 | INHALATION_SPRAY | Freq: Four times a day (QID) | RESPIRATORY_TRACT | 0 refills | Status: DC | PRN

## 2021-07-08 MED ORDER — PREDNISONE 20 MG PO TABS: ORAL_TABLET | ORAL | 0 refills | Status: DC

## 2021-07-08 MED ORDER — BENZONATATE 100 MG PO CAPS: 100.0000 mg | ORAL_CAPSULE | Freq: Three times a day (TID) | ORAL | 0 refills | Status: DC | PRN

## 2021-07-08 MED ORDER — CETIRIZINE HCL 10 MG PO TABS: 10.0000 mg | ORAL_TABLET | Freq: Every day | ORAL | 0 refills | Status: DC

## 2021-07-08 NOTE — ED Triage Notes (Signed)
Dry Cough x 2 to 3 days with tightness in chest.  States cough is getting worse.  Hx of asthma

## 2021-07-08 NOTE — ED Provider Notes (Signed)
[facility]   MRN: [id number] DOB: 1981/09/14  Subjective:   Jimmy Hancock is a 40 y.o. male presenting for 2 to 3-day history of acute onset persistent cough, chest tightness.  Patient has longstanding history of chronic allergic rhinitis, asthma.  Has used his albuterol inhaler successfully with good relief of his symptoms.  Unfortunately his current albuterol inhaler is out of date and would like a refill.  Denies fever, sinus pain, throat pain, chest pain, shortness of breath, body aches.  Uses albuterol inhaler.    Allergies  Allergen Reactions   Soap     Past Medical History:  Diagnosis Date   Asthma      Past Surgical History:  Procedure Laterality Date   ESOPHAGOGASTRODUODENOSCOPY     TONSILLECTOMY     TONSILLECTOMY AND ADENOIDECTOMY      Family History  Problem Relation Age of Onset   Arthritis Mother    Hearing loss Maternal Grandfather    Heart attack Maternal Grandfather     Social History   Tobacco Use   Smoking status: Never   Smokeless tobacco: Never  Vaping Use   Vaping Use: Never used  Substance Use Topics   Alcohol use: Yes    Comment: occasionally    Drug use: No    ROS   Objective:   Vitals: BP (!) 138/95 (BP Location: Right Arm)   Pulse 71   Temp 98.7 F (37.1 C) (Oral)   Resp 16   SpO2 97%   Physical Exam Constitutional:      General: He is not in acute distress.    Appearance: Normal appearance. He is well-developed. He is not ill-appearing, toxic-appearing or diaphoretic.  HENT:     Head: Normocephalic and atraumatic.     Right Ear: External ear normal.     Left Ear: External ear normal.     Nose: Nose normal.     Mouth/Throat:     Mouth: Mucous membranes are moist.     Pharynx: Oropharynx is clear.  Eyes:     General: No scleral icterus.    Extraocular Movements: Extraocular movements intact.     Pupils: Pupils are equal, round, and reactive to light.  Cardiovascular:     Rate and Rhythm:  Normal rate and regular rhythm.     Heart sounds: Normal heart sounds. No murmur heard.   No friction rub. No gallop.  Pulmonary:     Effort: Pulmonary effort is normal. No respiratory distress.     Breath sounds: Normal breath sounds. No stridor. No wheezing, rhonchi or rales.  Neurological:     Mental Status: He is alert and oriented to person, place, and time.  Psychiatric:        Mood and Affect: Mood normal.        Behavior: Behavior normal.        Thought Content: Thought content normal.     Assessment and Plan :   PDMP not reviewed this encounter.  1. Mild intermittent asthma without complication   2. Cough   3. Chest tightness     Patient prefers to defer COVID-19 testing as he does not have any sick symptoms.  I was agreeable to this given clear cardiopulmonary exam, deferred imaging as a result.  Recommended oral prednisone course, refilled his albuterol inhaler, start Zyrtec and use supportive care.  Discussed possibility of his asthma flaring up due to the weather changes versus a viral upper respiratory infection.  Counseled patient on potential for  adverse effects with medications prescribed/recommended today, ER and return-to-clinic precautions discussed, patient verbalized understanding.    Wallis Bamberg, [place] 07/08/21 [phone]

## 2022-04-22 IMAGING — DX DG HAND COMPLETE 3+V*R*
3 series · 3 of 3 positions shown · non-contrast
Comparison: None.

CLINICAL DATA: Pain and swelling.  Recent trauma

EXAM:
RIGHT HAND - COMPLETE 3+ VIEW

[hand pa]
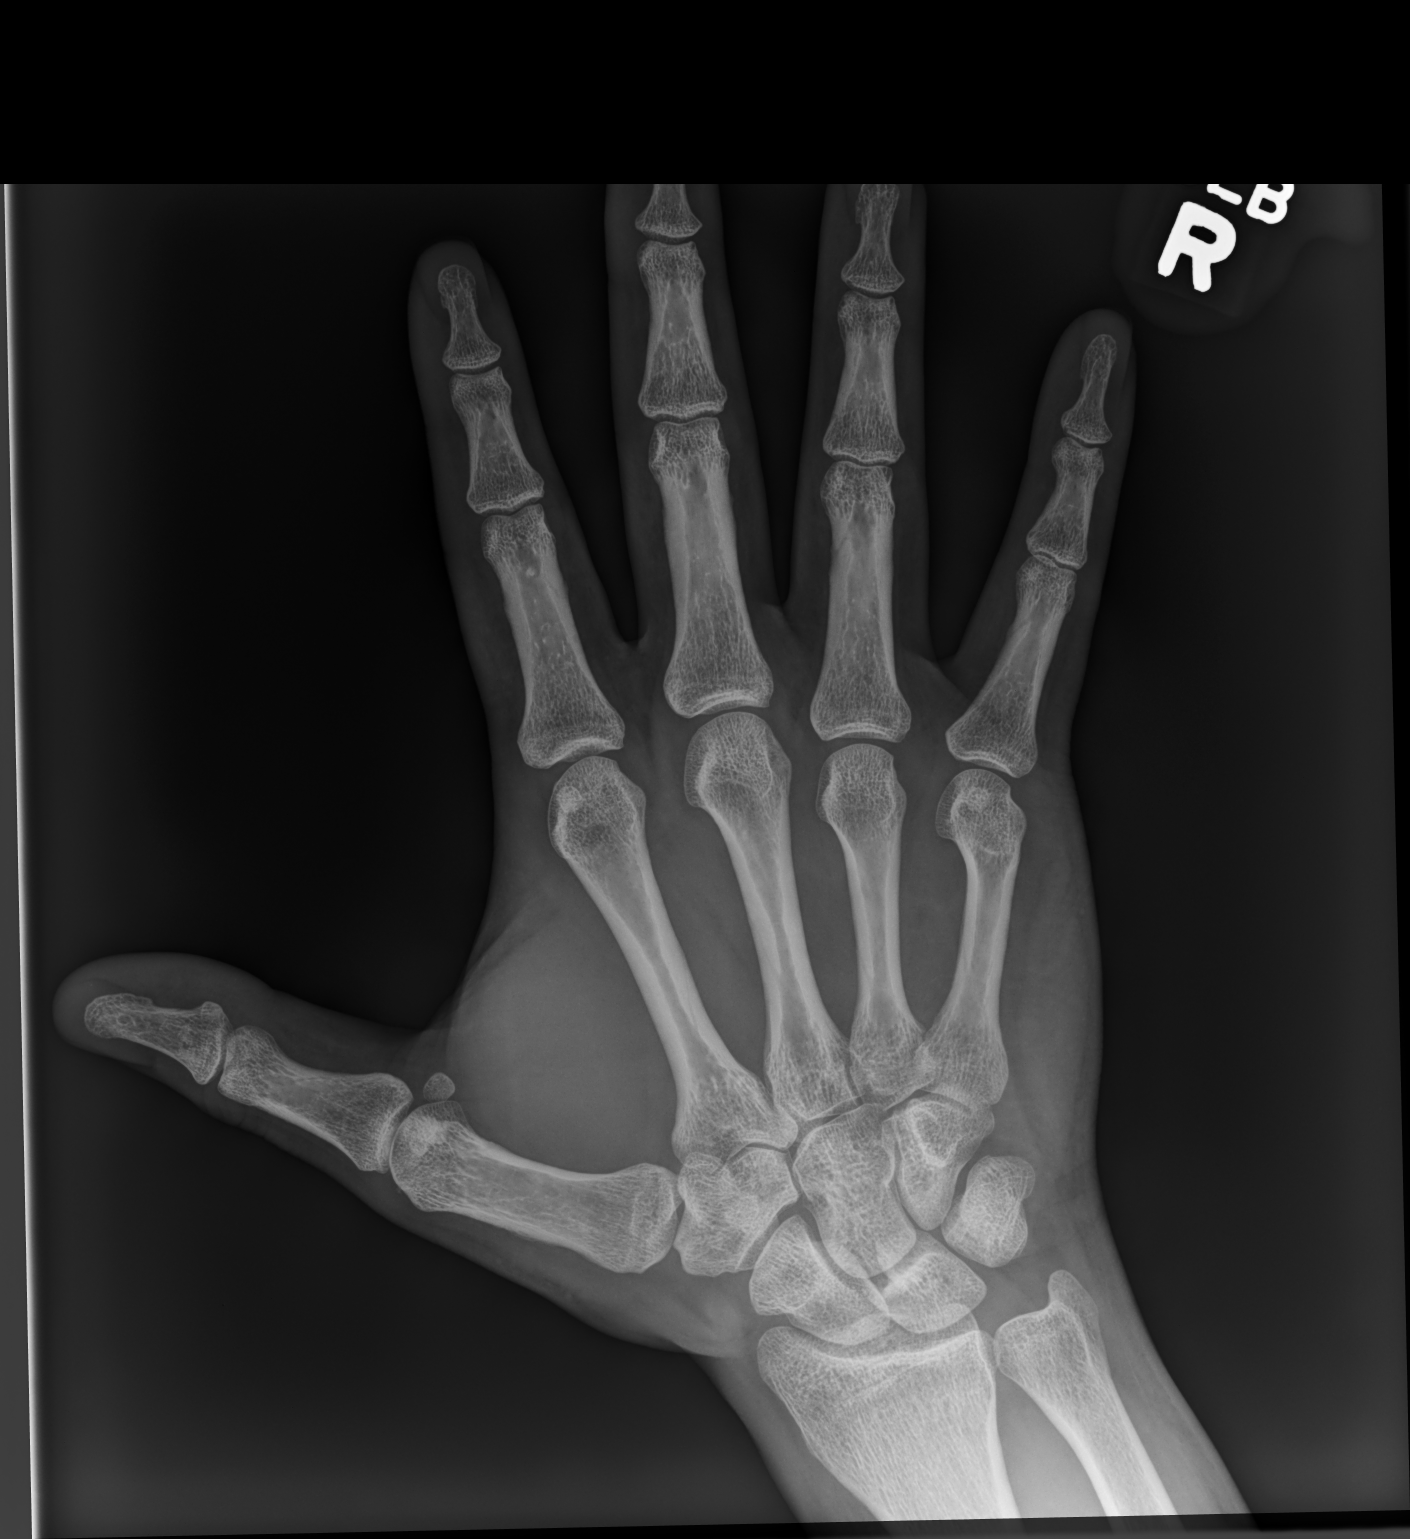

[hand mlo]
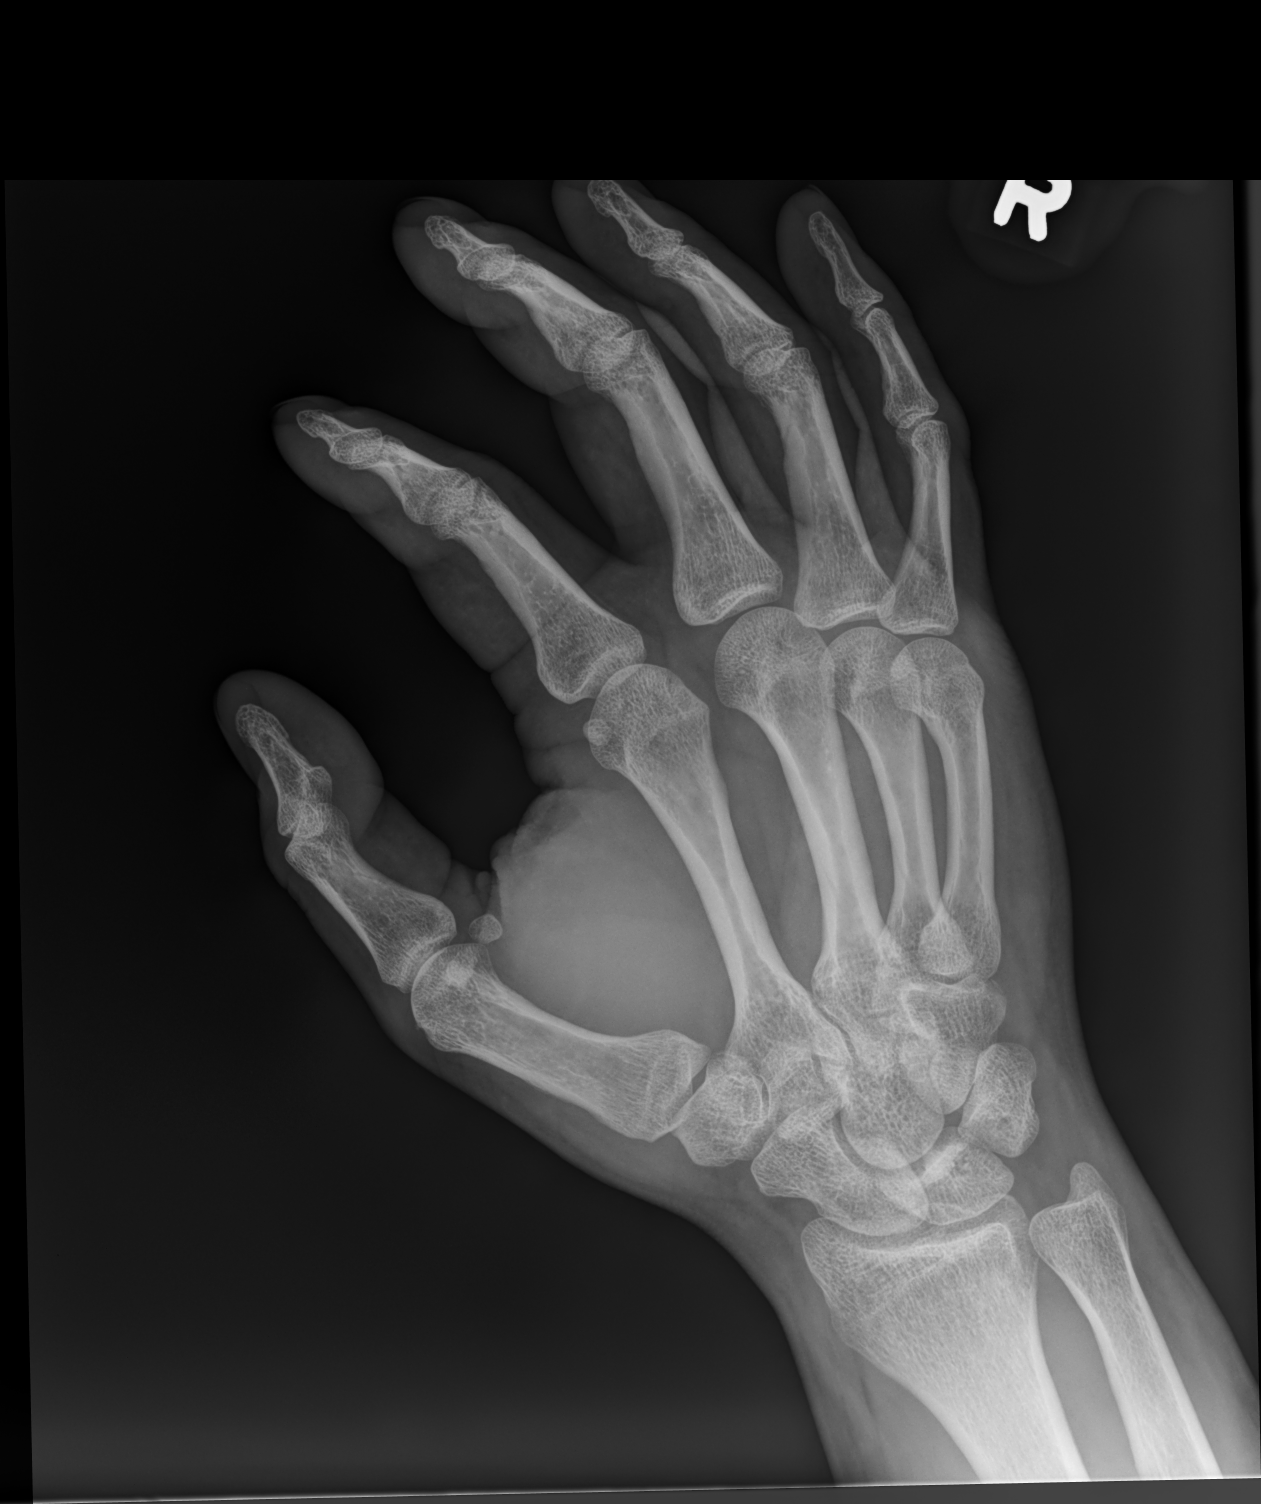

[hand lat]
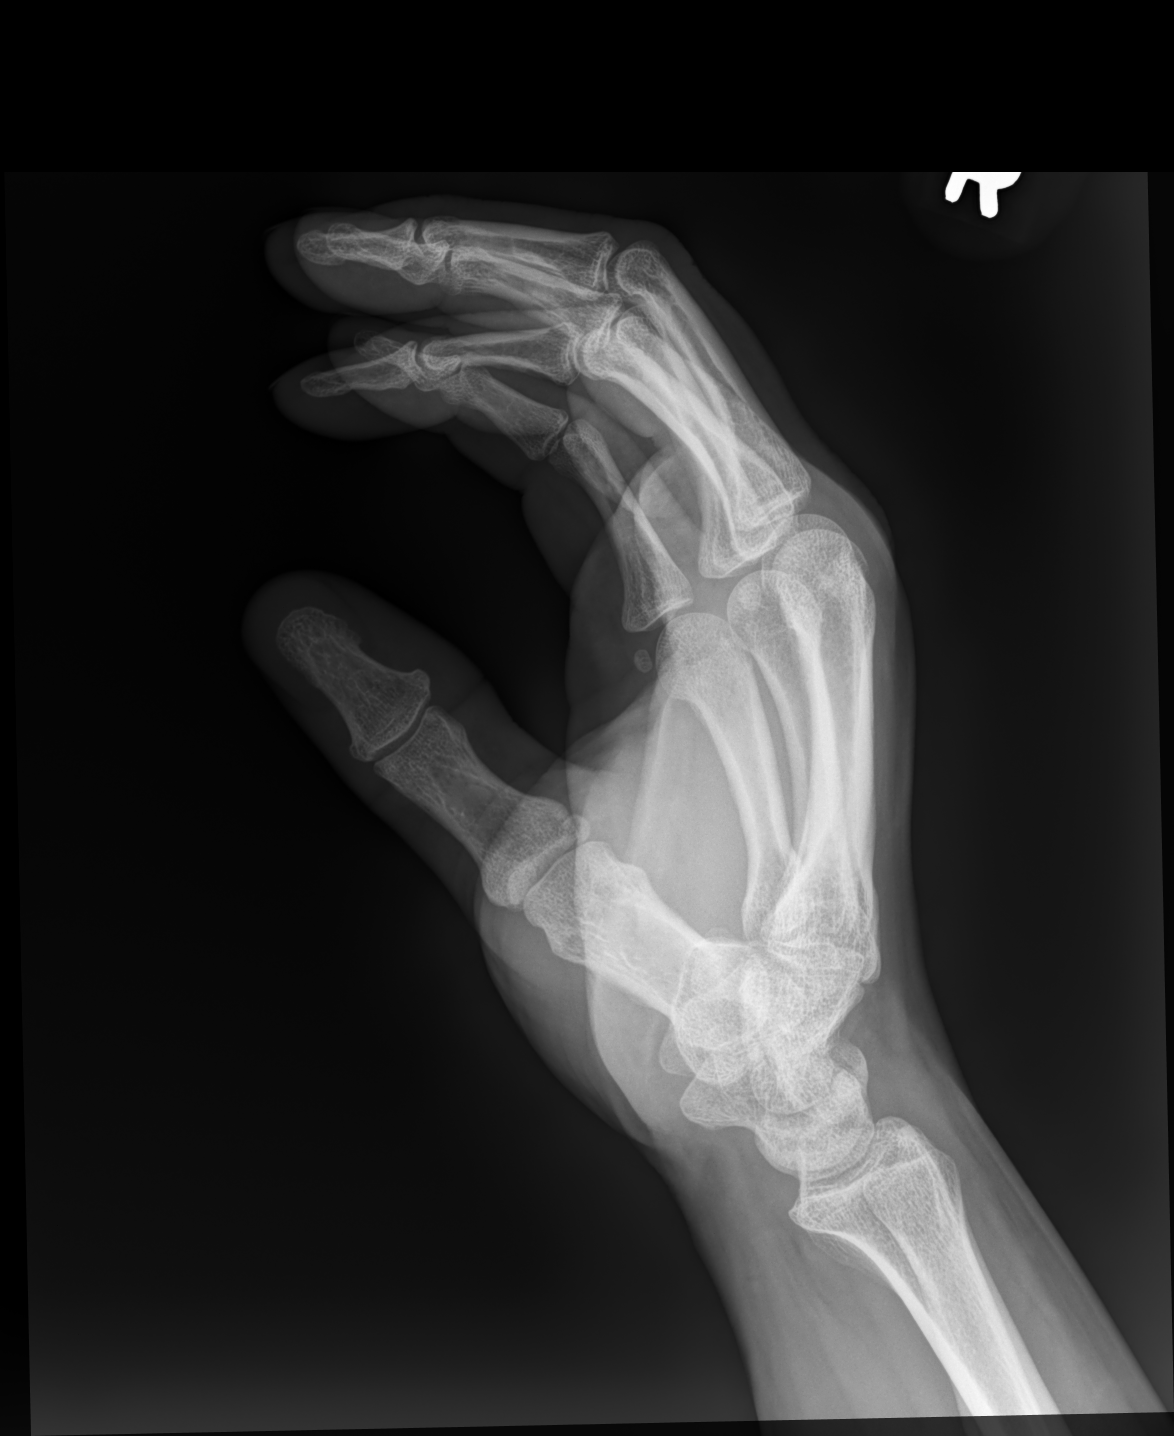

[3 of 3 positions shown; findings below may reference images not displayed]

FINDINGS: Frontal, oblique, and lateral views obtained. No acute fracture or
dislocation. Remodeling in the distal fifth metacarpal region is
likely indicative of prior trauma with healing. Joint spaces appear
normal. No erosive change.
IMPRESSION: No acute fracture or dislocation. Evidence of prior trauma with
remodeling distal fifth metacarpal. No appreciable arthropathic
change.

## 2022-10-08 ENCOUNTER — Ambulatory Visit
Admission: EM | Admit: 2022-10-08 | Discharge: 2022-10-08 | Disposition: A | Discharge status: Home / Self Care | Payer: Managed Care, Other (non HMO) | Attending: Nurse Practitioner | Admitting: Nurse Practitioner

## 2022-10-08 DIAGNOSIS — L0231 Cutaneous abscess of buttock: Secondary | ICD-10-CM

## 2022-10-08 MED ORDER — DOXYCYCLINE HYCLATE 100 MG PO CAPS: 100.0000 mg | ORAL_CAPSULE | Freq: Two times a day (BID) | ORAL | 0 refills | Status: AC

## 2022-10-08 NOTE — ED Triage Notes (Signed)
Pt reports having a cyst around rectum x 4-5 days.

## 2022-10-08 NOTE — Discharge Instructions (Signed)
Please take the doxycycline to help treat infection under the surface of your skin.  Continue warm compresses and sitz baths to help draw out infection.  Follow up with persistent or worsening symptoms.

## 2022-10-08 NOTE — ED Provider Notes (Signed)
[facility]    CSN: [id number] Arrival date & time: 10/08/22  1324      History   Chief Complaint Chief Complaint  Patient presents with   Abscess    HPI Jimmy Hancock is a 41 y.o. male.   Patient presents for possible abscess to the right buttock.  Reports has been present for the past 4 to 5 days and only bothering him for the past 1 to 2 days.  He denies any drainage or redness from the area.  It is painful to touch and when he sits for too long.  Denies fever, nausea/vomiting.  Has tried warm compresses and sitz bath without relief so far.  He does report a history of abscesses in the same area as well as pilonidal abscess.    Past Medical History:  Diagnosis Date   Asthma     Patient Active Problem List   Diagnosis Date Noted   Annual physical exam 10/21/2018   Chronic allergic rhinitis 10/21/2018   Primary osteoarthritis of both hands 10/21/2018    Past Surgical History:  Procedure Laterality Date   ESOPHAGOGASTRODUODENOSCOPY     TONSILLECTOMY     TONSILLECTOMY AND ADENOIDECTOMY         Home Medications    Prior to Admission medications   Medication Sig Start Date End Date Taking? Authorizing Provider  doxycycline (VIBRAMYCIN) 100 MG capsule Take 1 capsule (100 mg total) by mouth 2 (two) times daily for 7 days. 10/08/22 10/15/22 Yes Eulogio Bear, NP  albuterol (VENTOLIN HFA) 108 (90 Base) MCG/ACT inhaler Inhale 1-2 puffs into the lungs every 6 (six) hours as needed for wheezing or shortness of breath. 07/08/21   Jaynee Eagles, PA-C  cetirizine (ZYRTEC ALLERGY) 10 MG tablet Take 1 tablet (10 mg total) by mouth daily. 07/08/21   Jaynee Eagles, PA-C  meloxicam (MOBIC) 15 MG tablet Take 1 tablet (15 mg total) by mouth daily. 06/14/21   Lestine Box, PA-C    Family History Family History  Problem Relation Age of Onset   Arthritis Mother    Hearing loss Maternal Grandfather    Heart attack Maternal Grandfather     Social  History Social History   Tobacco Use   Smoking status: Never   Smokeless tobacco: Never  Vaping Use   Vaping Use: Never used  Substance Use Topics   Alcohol use: Yes    Comment: occasionally    Drug use: No     Allergies   Soap   Review of Systems Review of Systems Per HPI  Physical Exam Triage Vital Signs ED Triage Vitals  Enc Vitals Group     BP 10/08/22 1334 134/82     Pulse Rate 10/08/22 1334 84     Resp 10/08/22 1334 16     Temp 10/08/22 1334 98.2 F (36.8 C)     Temp Source 10/08/22 1334 Oral     SpO2 10/08/22 1334 97 %     Weight --      Height --      Head Circumference --      Peak Flow --      Pain Score 10/08/22 1332 5     Pain Loc --      Pain Edu? --      Excl. in [place]? --    No data found.  Updated Vital Signs BP 134/82 (BP Location: Right Arm)   Pulse 84   Temp 98.2 F (36.8 C) (Oral)   Resp  16   SpO2 97%   Visual Acuity Right Eye Distance:   Left Eye Distance:   Bilateral Distance:    Right Eye Near:   Left Eye Near:    Bilateral Near:     Physical Exam Vitals and nursing note reviewed. Chaperone present: patient declined chaperone.  Constitutional:      General: He is not in acute distress.    Appearance: Normal appearance. He is not toxic-appearing.  HENT:     Head: Normocephalic and atraumatic.     Mouth/Throat:     Mouth: Mucous membranes are moist.     Pharynx: Oropharynx is clear.  Pulmonary:     Effort: Pulmonary effort is normal. No respiratory distress.  Skin:    General: Skin is warm and dry.     Capillary Refill: Capillary refill takes less than 2 seconds.     Coloration: Skin is not pale.     Findings: Abscess present. No erythema or rash.          Comments: Firm, indurated abscess approximately 1 cm x 1 cm to right buttock in approximately area marked.  No fluctuance, redness, warmth, or active drainage.   Neurological:     Mental Status: He is alert and oriented to person, place, and time.  Psychiatric:         Behavior: Behavior is cooperative.      UC Treatments / Results  Labs (all labs ordered are listed, but only abnormal results are displayed) Labs Reviewed - No data to display  EKG   Radiology No results found.  Procedures Procedures (including critical care time)  Medications Ordered in UC Medications - No data to display  Initial Impression / Assessment and Plan / UC Course  I have reviewed the triage vital signs and the nursing notes.  Pertinent labs & imaging results that were available during my care of the patient were reviewed by me and considered in my medical decision making (see chart for details).    Patient is well-appearing, normotensive, afebrile, not tachycardic, not tachypneic, oxygenating well on room air.  Treat abscess with doxycycline twice daily for 7 days.  Continue supportive care.  ER and return precautions discussed.  The patient was given the opportunity to ask questions.  All questions answered to their satisfaction.  The patient is in agreement to this plan.    Final Clinical Impressions(s) / UC Diagnoses   Final diagnoses:  Abscess of buttock, right     Discharge Instructions      Please take the doxycycline to help treat infection under the surface of your skin.  Continue warm compresses and sitz baths to help draw out infection.  Follow up with persistent or worsening symptoms.    ED Prescriptions     Medication Sig Dispense Auth. Provider   doxycycline (VIBRAMYCIN) 100 MG capsule Take 1 capsule (100 mg total) by mouth 2 (two) times daily for 7 days. 14 capsule Eulogio Bear, NP      PDMP not reviewed this encounter.   Eulogio Bear, NP 10/08/22 1352

## 2022-10-10 ENCOUNTER — Other Ambulatory Visit: Payer: Self-pay

## 2022-10-10 ENCOUNTER — Ambulatory Visit
Admission: EM | Admit: 2022-10-10 | Discharge: 2022-10-10 | Disposition: A | Discharge status: Home / Self Care | Payer: Managed Care, Other (non HMO) | Attending: Nurse Practitioner | Admitting: Nurse Practitioner

## 2022-10-10 ENCOUNTER — Encounter: Payer: Self-pay | Admitting: Emergency Medicine

## 2022-10-10 DIAGNOSIS — L0231 Cutaneous abscess of buttock: Secondary | ICD-10-CM | POA: Diagnosis not present

## 2022-10-10 NOTE — Discharge Instructions (Signed)
We drained the abscess on your buttock today; this will likely continue to drain over the next few days.  Continue the oral antibiotic to help with infection as well as warm compresses and sitz baths.    If you develop fever, nausea/vomiting, changes in mental status, go to the ER

## 2022-10-10 NOTE — ED Triage Notes (Signed)
Pt reports was seen for same 4-5 days and reports is currently on abx and states "pressure has increased and Im not sure if it needs to be drained."

## 2022-10-10 NOTE — ED Provider Notes (Signed)
[facility]    CSN: [id number] Arrival date & time: 10/10/22  0913      History   Chief Complaint Chief Complaint  Patient presents with   Abscess    HPI Jimmy Hancock is a 41 y.o. male.   Patient presents for abscess follow up.  He was seen in urgent care couple of days ago for the same complaint was started on doxycycline.  He reports has been taking the doxycycline, continue the warm compresses and warm soaks and thinks that the abscess has grown some and is requesting it be drained today.  He denies fever, nausea/vomiting, active drainage.    Past Medical History:  Diagnosis Date   Asthma     Patient Active Problem List   Diagnosis Date Noted   Annual physical exam 10/21/2018   Chronic allergic rhinitis 10/21/2018   Primary osteoarthritis of both hands 10/21/2018    Past Surgical History:  Procedure Laterality Date   ESOPHAGOGASTRODUODENOSCOPY     TONSILLECTOMY     TONSILLECTOMY AND ADENOIDECTOMY         Home Medications    Prior to Admission medications   Medication Sig Start Date End Date Taking? Authorizing Provider  albuterol (VENTOLIN HFA) 108 (90 Base) MCG/ACT inhaler Inhale 1-2 puffs into the lungs every 6 (six) hours as needed for wheezing or shortness of breath. 07/08/21   Wallis Bamberg, PA-C  cetirizine (ZYRTEC ALLERGY) 10 MG tablet Take 1 tablet (10 mg total) by mouth daily. 07/08/21   Wallis Bamberg, PA-C  doxycycline (VIBRAMYCIN) 100 MG capsule Take 1 capsule (100 mg total) by mouth 2 (two) times daily for 7 days. 10/08/22 10/15/22  Valentino Nose, NP  meloxicam (MOBIC) 15 MG tablet Take 1 tablet (15 mg total) by mouth daily. 06/14/21   Rennis Harding, PA-C    Family History Family History  Problem Relation Age of Onset   Arthritis Mother    Hearing loss Maternal Grandfather    Heart attack Maternal Grandfather     Social History Social History   Tobacco Use   Smoking status: Never   Smokeless tobacco: Never   Vaping Use   Vaping Use: Never used  Substance Use Topics   Alcohol use: Yes    Comment: occasionally    Drug use: No     Allergies   Soap   Review of Systems Review of Systems Per HPI  Physical Exam Triage Vital Signs ED Triage Vitals  Enc Vitals Group     BP 10/10/22 0928 118/84     Pulse Rate 10/10/22 0928 96     Resp 10/10/22 0928 20     Temp 10/10/22 0928 99 F (37.2 C)     Temp Source 10/10/22 0928 Oral     SpO2 10/10/22 0928 98 %     Weight --      Height --      Head Circumference --      Peak Flow --      Pain Score 10/10/22 0929 7     Pain Loc --      Pain Edu? --      Excl. in [place]? --    No data found.  Updated Vital Signs BP 118/84 (BP Location: Right Arm)   Pulse 96   Temp 99 F (37.2 C) (Oral)   Resp 20   SpO2 98%   Visual Acuity Right Eye Distance:   Left Eye Distance:   Bilateral Distance:    Right Eye  Near:   Left Eye Near:    Bilateral Near:     Physical Exam Vitals and nursing note reviewed. Chaperone present: patient declined chaperone.  Constitutional:      General: He is not in acute distress.    Appearance: Normal appearance. He is not toxic-appearing.  HENT:     Head: Normocephalic and atraumatic.     Mouth/Throat:     Mouth: Mucous membranes are moist.     Pharynx: Oropharynx is clear.  Pulmonary:     Effort: Pulmonary effort is normal. No respiratory distress.  Skin:    General: Skin is warm and dry.     Capillary Refill: Capillary refill takes less than 2 seconds.     Coloration: Skin is not pale.     Findings: Abscess present. No erythema or rash.          Comments: Firm, indurated abscess approximately 2 cm x 1 cm with approximately 0.5 x 0.5 cm area of fluctuance and redness to right buttock in approximately area marked.  No active drainage.  The area is exquisitely tender to palpation.  Neurological:     Mental Status: He is alert and oriented to person, place, and time.  Psychiatric:        Behavior:  Behavior is cooperative.      UC Treatments / Results  Labs (all labs ordered are listed, but only abnormal results are displayed) Labs Reviewed - No data to display  EKG   Radiology No results found.  Procedures Incision and Drainage  Date/Time: 10/10/2022 10:10 AM  Performed by: Valentino Nose, NP Authorized by: Valentino Nose, NP   Consent:    Consent obtained:  Verbal   Consent given by:  Patient   Risks, benefits, and alternatives were discussed: yes     Risks discussed:  Bleeding, incomplete drainage, pain and infection   Alternatives discussed:  Delayed treatment Universal protocol:    Procedure explained and questions answered to patient or proxy's satisfaction: yes     Patient identity confirmed:  Verbally with patient Location:    Type:  Abscess   Size:  2 cm x 1 cm   Location:  Lower extremity   Lower extremity location:  Buttock   Buttock location:  R buttock Pre-procedure details:    Skin preparation:  Antiseptic wash and povidone-iodine Anesthesia:    Anesthesia method:  Local infiltration   Local anesthetic:  Lidocaine 2% WITH epi Procedure type:    Complexity:  Simple Procedure details:    Incision types:  Stab incision   Incision depth:  Dermal   Drainage:  Bloody   Drainage amount:  Scant   Wound treatment:  Wound left open   Packing materials:  None Post-procedure details:    Procedure completion:  Tolerated well, no immediate complications  (including critical care time)  Medications Ordered in UC Medications - No data to display  Initial Impression / Assessment and Plan / UC Course  I have reviewed the triage vital signs and the nursing notes.  Pertinent labs & imaging results that were available during my care of the patient were reviewed by me and considered in my medical decision making (see chart for details).  Patient is well-appearing, normotensive, afebrile, not tachycardic, not tachypneic, oxygenating well on room  air.    Abscess of buttock, right Incision and drainage of abscess as above - patient tolerated well and scant amount bloody drainage Continue previously prescribed doxycycline Wound care and aftercare discussed with  patient ER and return precautions discussed  The patient was given the opportunity to ask questions.  All questions answered to their satisfaction.  The patient is in agreement to this plan.    Final Clinical Impressions(s) / UC Diagnoses   Final diagnoses:  Abscess of buttock, right     Discharge Instructions      We drained the abscess on your buttock today; this will likely continue to drain over the next few days.  Continue the oral antibiotic to help with infection as well as warm compresses and sitz baths.    If you develop fever, nausea/vomiting, changes in mental status, go to the ER     ED Prescriptions   None    PDMP not reviewed this encounter.   Eulogio Bear, NP 10/10/22 1014

## 2022-10-30 ENCOUNTER — Encounter: Payer: Self-pay | Admitting: Family Medicine

## 2022-10-30 ENCOUNTER — Ambulatory Visit (INDEPENDENT_AMBULATORY_CARE_PROVIDER_SITE_OTHER): Payer: Managed Care, Other (non HMO) | Admitting: Family Medicine

## 2022-10-30 DIAGNOSIS — K611 Rectal abscess: Secondary | ICD-10-CM

## 2022-10-30 DIAGNOSIS — E119 Type 2 diabetes mellitus without complications: Secondary | ICD-10-CM | POA: Insufficient documentation

## 2022-10-30 MED ORDER — METFORMIN HCL 1000 MG PO TABS: 1000.0000 mg | ORAL_TABLET | Freq: Two times a day (BID) | ORAL | 1 refills | Status: DC

## 2022-10-30 NOTE — Assessment & Plan Note (Signed)
I have examined the perirectal abscess area. There is healthy granulation tissue in the wound bed and the opening is very small, packing had been dislodged. There was no surrounding erythema or edema, no induration or fluctuance. I advised the patient that he no longer needed to keeping packing the wound.  Continue with daily dry dressing changes.

## 2022-10-30 NOTE — Progress Notes (Signed)
New Patient Office Visit  Subjective    Patient ID: Jimmy Hancock, male    DOB: Mar 02, 1981  Age: 41 y.o. MRN: [id number]  CC:  Chief Complaint  Patient presents with   Establish Care    HPI Jimmy Hancock presents to establish care Patient was recently admitted to [facility] in [place], states that he was diagnosed with diabetes at the hospital stay and was placed on metformin. He had a perirectal abscess and it had to be opened and drained in the hospital. He states that it is much better, it was packed and he has been performing routine packing changes. States that the opening is getting smaller and it is getting somewhat more difficult to keep it packed. He denies any fever/chills, no pain in the area.   DM-- pt was newly diagnosed with Diabetes Mellitus, his A1C was 9.1 at the time. When reviewing his labs he was a pre-diabetic in 2019 (A1C was 6.2). Patient reports he has not lost any recent weight, no blurry vision, no numbness or tingling. He reports they placed him on metformin 1000 mg BID, states that he sometimes forgets to take the medication but for the most part is taking it as least once per day. He is trying to reduce sugary beverages in his diet. States that it is very gradual. I have reviewed the labs from the hospital stay.   Outpatient Encounter Medications as of 10/30/2022  Medication Sig   albuterol (VENTOLIN HFA) 108 (90 Base) MCG/ACT inhaler Inhale 1-2 puffs into the lungs every 6 (six) hours as needed for wheezing or shortness of breath.   [DISCONTINUED] metFORMIN (GLUCOPHAGE) 1000 MG tablet Take 1,000 mg by mouth 2 (two) times daily.   metFORMIN (GLUCOPHAGE) 1000 MG tablet Take 1 tablet (1,000 mg total) by mouth 2 (two) times daily.   [DISCONTINUED] cetirizine (ZYRTEC ALLERGY) 10 MG tablet Take 1 tablet (10 mg total) by mouth daily.   [DISCONTINUED] meloxicam (MOBIC) 15 MG tablet Take 1 tablet (15 mg total) by mouth daily.   No  facility-administered encounter medications on file as of 10/30/2022.    Past Medical History:  Diagnosis Date   Asthma    Diabetes mellitus without complication (HCC)     Past Surgical History:  Procedure Laterality Date   ESOPHAGOGASTRODUODENOSCOPY     INCISION AND DRAINAGE ABSCESS ANAL     TONSILLECTOMY     TONSILLECTOMY AND ADENOIDECTOMY      Family History  Problem Relation Age of Onset   Arthritis Mother    Hearing loss Maternal Grandfather    Heart attack Maternal Grandfather    Heart disease Maternal Grandfather     Social History   Socioeconomic History   Marital status: Married    Spouse name: Not on file   Number of children: 0   Years of education: Not on file   Highest education level: Not on file  Occupational History   Occupation: Conservation officer, nature, night shift    Employer: [employer]  Tobacco Use   Smoking status: Never   Smokeless tobacco: Never  Vaping Use   Vaping Use: Never used  Substance and Sexual Activity   Alcohol use: Yes    Comment: occasionally    Drug use: No   Sexual activity: Yes  Other Topics Concern   Not on file  Social History Narrative   Not on file   Social Determinants of Health   Financial Resource Strain: Not on file  Food Insecurity:  Not on file  Transportation Needs: Not on file  Physical Activity: Not on file  Stress: Not on file  Social Connections: Not on file  Intimate Partner Violence: Not on file    Review of Systems  Constitutional:  Negative for chills, fever and weight loss.  Eyes:  Negative for blurred vision.  Cardiovascular:  Negative for chest pain.  Gastrointestinal:  Negative for blood in stool.        Objective    BP 122/84 (BP Location: Left Arm, Patient Position: Sitting, Cuff Size: Normal)   Pulse 89   Temp 98.5 F (36.9 C) (Oral)   Ht 5\' 9"  (1.753 m)   Wt 178 lb 8 oz (81 kg)   SpO2 99%   BMI 26.36 kg/m   Physical Exam Vitals reviewed.  Constitutional:      Appearance: Normal  appearance. He is well-groomed and normal weight.  Eyes:     Extraocular Movements: Extraocular movements intact.     Conjunctiva/sclera: Conjunctivae normal.  Neck:     Thyroid: No thyromegaly.  Cardiovascular:     Rate and Rhythm: Normal rate and regular rhythm.     Heart sounds: S1 normal and S2 normal. No murmur heard. Pulmonary:     Effort: Pulmonary effort is normal.     Breath sounds: Normal breath sounds and air entry. No rales.  Abdominal:     General: Abdomen is flat. Bowel sounds are normal.  Musculoskeletal:     Right lower leg: No edema.     Left lower leg: No edema.  Skin:    Findings: Wound (there is a small wound on the right lower buttock that is healing well, no drainage, induration or erythema) present.  Neurological:     General: No focal deficit present.     Mental Status: He is alert and oriented to person, place, and time.     Gait: Gait is intact.  Psychiatric:        Mood and Affect: Mood and affect normal.         Assessment & Plan:   Problem List Items Addressed This Visit       Digestive   Perirectal abscess    I have examined the perirectal abscess area. There is healthy granulation tissue in the wound bed and the opening is very small, packing had been dislodged. There was no surrounding erythema or edema, no induration or fluctuance. I advised the patient that he no longer needed to keeping packing the wound.  Continue with daily dry dressing changes.         Endocrine   Diabetes mellitus without complication ([place])    New diagnosis, pt will continue the metformin 1000 mg BID and continue to make dietary changes. I will order a c-peptide and insulin level to confirm that it's type 2 DM given his age at onset. I will see him back in 3 months for repeat A1C. Will get urine microalbumin and bmp today for follow up.       Relevant Medications   metFORMIN (GLUCOPHAGE) 1000 MG tablet   Other Relevant Orders   Basic Metabolic Panel    Microalbumin/Creatinine Ratio, Urine   C-peptide   Insulin, random    Return in about 3 months (around 01/30/2023) for Follow up DM.   Farrel Conners, MD

## 2022-10-30 NOTE — Assessment & Plan Note (Signed)
New diagnosis, pt will continue the metformin 1000 mg BID and continue to make dietary changes. I will order a c-peptide and insulin level to confirm that it's type 2 DM given his age at onset. I will see him back in 3 months for repeat A1C. Will get urine microalbumin and bmp today for follow up.

## 2022-11-05 ENCOUNTER — Other Ambulatory Visit: Payer: Managed Care, Other (non HMO)

## 2022-11-12 ENCOUNTER — Other Ambulatory Visit (INDEPENDENT_AMBULATORY_CARE_PROVIDER_SITE_OTHER): Payer: Managed Care, Other (non HMO)

## 2022-11-12 DIAGNOSIS — E119 Type 2 diabetes mellitus without complications: Secondary | ICD-10-CM | POA: Diagnosis not present

## 2022-11-12 LAB — BASIC METABOLIC PANEL
BUN: 20 mg/dL (ref 6–23)
CO2: 29 mEq/L (ref 19–32)
Calcium: 9.6 mg/dL (ref 8.4–10.5)
Chloride: 99 mEq/L (ref 96–112)
Creatinine, Ser: 0.95 mg/dL (ref 0.40–1.50)
GFR: 100.51 mL/min (ref >60.00)
Glucose, Bld: 106 mg/dL — ABNORMAL HIGH (ref 70–99)
Potassium: 3.8 mEq/L (ref 3.5–5.1)
Sodium: 137 mEq/L (ref 135–145)

## 2022-11-12 LAB — MICROALBUMIN / CREATININE URINE RATIO
Creatinine,U: 98.3 mg/dL
Microalb Creat Ratio: 9.4 mg/g (ref 0.0–30.0)
Microalb, Ur: 9.3 mg/dL — ABNORMAL HIGH (ref 0.0–1.9)

## 2022-11-13 LAB — INSULIN, RANDOM: Insulin: 18.5 u[IU]/mL — ABNORMAL HIGH

## 2022-11-13 LAB — C-PEPTIDE: C-Peptide: 3.87 ng/mL — ABNORMAL HIGH (ref 0.80–3.85)

## 2023-02-03 ENCOUNTER — Telehealth: Payer: Self-pay | Admitting: Family Medicine

## 2023-02-03 ENCOUNTER — Ambulatory Visit: Payer: Managed Care, Other (non HMO) | Admitting: Family Medicine

## 2023-02-03 VITALS — BP 120/84 | HR 99

## 2023-02-03 DIAGNOSIS — J4520 Mild intermittent asthma, uncomplicated: Secondary | ICD-10-CM | POA: Diagnosis not present

## 2023-02-03 DIAGNOSIS — J069 Acute upper respiratory infection, unspecified: Secondary | ICD-10-CM

## 2023-02-03 DIAGNOSIS — E119 Type 2 diabetes mellitus without complications: Secondary | ICD-10-CM | POA: Diagnosis not present

## 2023-02-03 LAB — POCT GLYCOSYLATED HEMOGLOBIN (HGB A1C): Hemoglobin A1C: 6.4 % — AB (ref 4.0–5.6)

## 2023-02-03 LAB — LIPID PANEL
Cholesterol: 196 mg/dL (ref 0–200)
HDL: 36.7 mg/dL — ABNORMAL LOW (ref >39.00)
NonHDL: 158.96
Total CHOL/HDL Ratio: 5
Triglycerides: 202 mg/dL — ABNORMAL HIGH (ref 0.0–149.0)
VLDL: 40.4 mg/dL — ABNORMAL HIGH (ref 0.0–40.0)

## 2023-02-03 LAB — LDL CHOLESTEROL, DIRECT: Direct LDL: 132 mg/dL

## 2023-02-03 MED ORDER — ALBUTEROL SULFATE HFA 108 (90 BASE) MCG/ACT IN AERS: 1.0000 | INHALATION_SPRAY | Freq: Four times a day (QID) | RESPIRATORY_TRACT | 2 refills | Status: DC | PRN

## 2023-02-03 NOTE — Patient Instructions (Signed)
When you go see your eye doctor, ask him for a dilated eye exam for diabetes.

## 2023-02-03 NOTE — Progress Notes (Unsigned)
Established Patient Office Visit  Subjective   Patient ID: Jimmy Hancock, male    DOB: 1981/09/18  Age: 42 y.o. MRN: [id number]  Chief Complaint  Patient presents with   Medical Management of Chronic Issues   Cough    Productive with yellow-clear sputum x2 days, tried cough drops with some relief and states the home Covid test was negative   Sinus Problem    Patient complains of clear nasal drainage x2 days    Pt is reporting 2 day history of nasal congestion, coughing, states his covid test was negative. So sore throat, he does report some PND. No known sick contacts, no fever/chills. Pt has a history of mild intermittent asthma, very rarely uses his rescue inhaler.   Diabetes-- patient states he has been reducing sugar in his diet, states he stopped drinking sugary beverages. States that he only occasionally drinks something sugary. A1C performed in office today and is 6.4. he reports no blurry vision, no increased thirst or hunger. He is due for a lipid panel today. Pt states he has an eye doctor, I advised him that he needs a diabetic eye exam - pt states he will schedule this.    Current Outpatient Medications  Medication Instructions   albuterol (VENTOLIN HFA) 108 (90 Base) MCG/ACT inhaler 1-2 puffs, Inhalation, Every 6 hours PRN   metFORMIN (GLUCOPHAGE) 1,000 mg, Oral, 2 times daily     Patient Active Problem List   Diagnosis Date Noted   Diabetes mellitus without complication ([place]) 61/95/0932   Perirectal abscess 10/30/2022   Annual physical exam 10/21/2018   Chronic allergic rhinitis 10/21/2018   Primary osteoarthritis of both hands 10/21/2018      Review of Systems  All other systems reviewed and are negative.     Objective:     BP 120/84   Pulse 99   SpO2 99%    Physical Exam Vitals reviewed.  Constitutional:      Appearance: Normal appearance. He is well-groomed and normal weight.  HENT:     Right Ear: Tympanic membrane and ear canal normal.      Left Ear: Tympanic membrane and ear canal normal.     Nose: Congestion present.     Mouth/Throat:     Mouth: Mucous membranes are moist.     Pharynx: No posterior oropharyngeal erythema.  Eyes:     Extraocular Movements: Extraocular movements intact.     Conjunctiva/sclera: Conjunctivae normal.  Cardiovascular:     Rate and Rhythm: Normal rate and regular rhythm.     Heart sounds: S1 normal and S2 normal. No murmur heard. Pulmonary:     Effort: Pulmonary effort is normal.     Breath sounds: Normal breath sounds and air entry. No rales.  Abdominal:     General: Abdomen is flat. Bowel sounds are normal.  Musculoskeletal:     Right lower leg: No edema.     Left lower leg: No edema.  Lymphadenopathy:     Cervical: No cervical adenopathy.  Neurological:     General: No focal deficit present.     Mental Status: He is alert and oriented to person, place, and time.     Gait: Gait is intact.  Psychiatric:        Mood and Affect: Mood and affect normal.    Last metabolic panel Lab Results  Component Value Date   GLUCOSE 106 (H) 11/12/2022   NA 137 11/12/2022   K 3.8 11/12/2022   CL 99 11/12/2022  CO2 29 11/12/2022   BUN 20 11/12/2022   CREATININE 0.95 11/12/2022   CALCIUM 9.6 11/12/2022   PROT 7.5 10/21/2018   ALBUMIN 5.0 10/21/2018   BILITOT 0.6 10/21/2018   ALKPHOS 111 10/21/2018   AST 30 10/21/2018   ALT 60 (H) 10/21/2018      The 10-year ASCVD risk score (Arnett DK, et al., 2019) is: 2.9%    Assessment & Plan:   Problem List Items Addressed This Visit       Unprioritized   Diabetes mellitus without complication ([place]) - Primary    A1C today is 6.4, well controlled with the metformin 1000 mg BID and dietary changes. Will check lipid panel today for CVD risk assessment. RTC every 6 months for A1C surveillance.       Relevant Orders   POC HgB A1c (Completed)   Lipid Panel (Completed)   Other Visit Diagnoses     Viral upper respiratory tract infection       Physical exam findings are benign today, most likely a viral URI, recommend OTC medication for symptom control.    Mild intermittent asthma without complication       Relevant Medications   Lungs clear on exam today however pt needs refills of his rescue inhaler, will refill his albuterol 90 mcg, 2 puffs every 6 hours PRN wheezing.   albuterol (VENTOLIN HFA) 108 (90 Base) MCG/ACT inhaler       Return in about 6 months (around 08/04/2023) for DM, annual physical exam.    Farrel Conners, MD

## 2023-02-03 NOTE — Telephone Encounter (Signed)
Patient saw Dr. Legrand Como today--States he needs a refill on his Albuterol inhaler--His inhaler is expired.  He said he doesn't use it often so it lasts awhile.  Pharmacy-  [facility]

## 2023-02-03 NOTE — Telephone Encounter (Signed)
Rx sent 

## 2023-02-04 NOTE — Assessment & Plan Note (Signed)
A1C today is 6.4, well controlled with the metformin 1000 mg BID and dietary changes. Will check lipid panel today for CVD risk assessment. RTC every 6 months for A1C surveillance.

## 2023-08-11 ENCOUNTER — Ambulatory Visit (INDEPENDENT_AMBULATORY_CARE_PROVIDER_SITE_OTHER): Payer: Managed Care, Other (non HMO) | Admitting: Family Medicine

## 2023-08-11 ENCOUNTER — Encounter: Payer: Self-pay | Admitting: Family Medicine

## 2023-08-11 VITALS — BP 120/80 | HR 95 | Temp 98.6°F | Ht 69.0 in | Wt 172.3 lb

## 2023-08-11 DIAGNOSIS — Z7984 Long term (current) use of oral hypoglycemic drugs: Secondary | ICD-10-CM | POA: Diagnosis not present

## 2023-08-11 DIAGNOSIS — Z0000 Encounter for general adult medical examination without abnormal findings: Secondary | ICD-10-CM

## 2023-08-11 DIAGNOSIS — E119 Type 2 diabetes mellitus without complications: Secondary | ICD-10-CM | POA: Diagnosis not present

## 2023-08-11 MED ORDER — METFORMIN HCL 1000 MG PO TABS: 1000.0000 mg | ORAL_TABLET | Freq: Two times a day (BID) | ORAL | 1 refills | Status: DC

## 2023-08-11 NOTE — Progress Notes (Signed)
Complete physical exam  Patient: Jimmy Hancock   DOB: 08-21-1981   42 y.o. Male  MRN: [id number]  Subjective:    Chief Complaint  Patient presents with   Annual Exam    Patient requests to change to the extended release form for Metformin    Jimmy Hancock is a 42 y.o. male who presents today for a complete physical exam. He reports consuming a  trying to avoid sugary foods  diet. Home exercise routine includes exercises with his VR. He generally feels well. He reports sleeping fairly well. He does not have additional problems to discuss today.    Most recent fall risk assessment:    10/21/2018    8:18 AM  Fall Risk   Falls in the past year? No     Most recent depression screenings:    08/11/2023    7:57 AM 02/03/2023    9:17 AM  PHQ 2/9 Scores  PHQ - 2 Score 2 1  PHQ- 9 Score 6 5    Vision:Within last year and Dental: No current dental problems and Receives regular dental care   Patient Active Problem List   Diagnosis Date Noted   Diabetes mellitus without complication (HCC) 10/30/2022   Perirectal abscess 10/30/2022   Annual physical exam 10/21/2018   Chronic allergic rhinitis 10/21/2018   Primary osteoarthritis of both hands 10/21/2018    The 10-year ASCVD risk score (Arnett DK, et al., 2019) is: 2.9%   Values used to calculate the score:     Age: 42 years     Sex: Male     Is Non-Hispanic African American: No     Diabetic: Yes     Tobacco smoker: No     Systolic Blood Pressure: 120 mmHg     Is BP treated: No     HDL Cholesterol: 36.7 mg/dL     Total Cholesterol: 196 mg/dL   Patient Care Team: Karie Georges, MD as PCP - General (Family Medicine)   Outpatient Medications Prior to Visit  Medication Sig   albuterol (VENTOLIN HFA) 108 (90 Base) MCG/ACT inhaler Inhale 1-2 puffs into the lungs every 6 (six) hours as needed for wheezing or shortness of breath.   [DISCONTINUED] metFORMIN (GLUCOPHAGE) 1000 MG tablet Take 1 tablet (1,000 mg  total) by mouth 2 (two) times daily.   No facility-administered medications prior to visit.    Review of Systems  HENT:  Negative for hearing loss.   Eyes:  Negative for blurred vision.  Respiratory:  Negative for shortness of breath.   Cardiovascular:  Negative for chest pain.  Gastrointestinal: Negative.   Genitourinary: Negative.   Musculoskeletal:  Negative for back pain.  Neurological:  Negative for headaches.  Psychiatric/Behavioral:  Negative for depression.   All other systems reviewed and are negative.      Objective:     BP 120/80 (BP Location: Left Arm, Patient Position: Sitting, Cuff Size: Normal)   Pulse 95   Temp 98.6 F (37 C) (Oral)   Ht 5\' 9"  (1.753 m)   Wt 172 lb 4.8 oz (78.2 kg)   SpO2 98%   BMI 25.44 kg/m    Physical Exam Vitals reviewed.  Constitutional:      Appearance: Normal appearance. He is well-groomed and normal weight.  HENT:     Right Ear: Tympanic membrane and ear canal normal.     Left Ear: Tympanic membrane and ear canal normal.     Mouth/Throat:     Mouth:  Mucous membranes are moist.     Pharynx: No posterior oropharyngeal erythema.  Eyes:     Extraocular Movements: Extraocular movements intact.     Conjunctiva/sclera: Conjunctivae normal.  Neck:     Thyroid: No thyromegaly.  Cardiovascular:     Rate and Rhythm: Normal rate and regular rhythm.     Heart sounds: S1 normal and S2 normal. No murmur heard. Pulmonary:     Effort: Pulmonary effort is normal.     Breath sounds: Normal breath sounds and air entry. No rales.  Abdominal:     General: Abdomen is flat. Bowel sounds are normal.  Musculoskeletal:     Right lower leg: No edema.     Left lower leg: No edema.  Lymphadenopathy:     Cervical: No cervical adenopathy.  Neurological:     General: No focal deficit present.     Mental Status: He is alert and oriented to person, place, and time.     Gait: Gait is intact.  Psychiatric:        Mood and Affect: Mood and affect  normal.      No results found for any visits on 08/11/23. Last metabolic panel Lab Results  Component Value Date   GLUCOSE 106 (H) 11/12/2022   NA 137 11/12/2022   K 3.8 11/12/2022   CL 99 11/12/2022   CO2 29 11/12/2022   BUN 20 11/12/2022   CREATININE 0.95 11/12/2022   GFR 100.51 11/12/2022   CALCIUM 9.6 11/12/2022   PROT 7.5 10/21/2018   ALBUMIN 5.0 10/21/2018   BILITOT 0.6 10/21/2018   ALKPHOS 111 10/21/2018   AST 30 10/21/2018   ALT 60 (H) 10/21/2018   Last lipids Lab Results  Component Value Date   CHOL 196 02/03/2023   HDL 36.70 (L) 02/03/2023   LDLDIRECT 132.0 02/03/2023   TRIG 202.0 (H) 02/03/2023   CHOLHDL 5 02/03/2023        Assessment & Plan:    Routine Health Maintenance and Physical Exam  Immunization History  Administered Date(s) Administered   Influenza,inj,Quad PF,6+ Mos 10/21/2018   Influenza-Unspecified 10/16/2022   Tdap 10/21/2018    Health Maintenance  Topic Date Due   FOOT EXAM  Never done   OPHTHALMOLOGY EXAM  Never done   INFLUENZA VACCINE  07/31/2023   HEMOGLOBIN A1C  08/04/2023   COVID-19 Vaccine (1 - 2023-24 season) 08/27/2023 (Originally 08/30/2022)   Hepatitis C Screening  10/31/2023 (Originally 11/29/2000)   Diabetic kidney evaluation - eGFR measurement  11/13/2023   Diabetic kidney evaluation - Urine ACR  11/13/2023   DTaP/Tdap/Td (2 - Td or Tdap) 10/21/2028   HIV Screening  Completed   HPV VACCINES  Aged Out    Discussed health benefits of physical activity, and encouraged him to engage in regular exercise appropriate for his age and condition.  Diabetes mellitus without complication (HCC) -     Lipid panel; Future -     Hemoglobin A1c; Future -     metFORMIN HCl; Take 1 tablet (1,000 mg total) by mouth 2 (two) times daily.  Dispense: 180 tablet; Refill: 1  Routine general medical examination at a health care facility  Normal physical exam findings today, handouts given on healthy eating and exercise. Counseled patient  on healthy sleep hygiene and advised him to attempt to increase the amount of sleep he is getting. RTC 6 months for DM follow up. Labs ordered today for surveillance.   Return in 6 months (on 02/11/2024) for DM.  Karie Georges, MD

## 2023-08-11 NOTE — Patient Instructions (Addendum)
Melatonin 5 mg at bedtime  Health Maintenance, Male Adopting a healthy lifestyle and getting preventive care are important in promoting health and wellness. Ask your health care provider about: The right schedule for you to have regular tests and exams. Things you can do on your own to prevent diseases and keep yourself healthy. What should I know about diet, weight, and exercise? Eat a healthy diet  Eat a diet that includes plenty of vegetables, fruits, low-fat dairy products, and lean protein. Do not eat a lot of foods that are high in solid fats, added sugars, or sodium. Maintain a healthy weight Body mass index (BMI) is a measurement that can be used to identify possible weight problems. It estimates body fat based on height and weight. Your health care provider can help determine your BMI and help you achieve or maintain a healthy weight. Get regular exercise Get regular exercise. This is one of the most important things you can do for your health. Most adults should: Exercise for at least 150 minutes each week. The exercise should increase your heart rate and make you sweat (moderate-intensity exercise). Do strengthening exercises at least twice a week. This is in addition to the moderate-intensity exercise. Spend less time sitting. Even light physical activity can be beneficial. Watch cholesterol and blood lipids Have your blood tested for lipids and cholesterol at 41 years of age, then have this test every 5 years. You may need to have your cholesterol levels checked more often if: Your lipid or cholesterol levels are high. You are older than 41 years of age. You are at high risk for heart disease. What should I know about cancer screening? Many types of cancers can be detected early and may often be prevented. Depending on your health history and family history, you may need to have cancer screening at various ages. This may include screening for: Colorectal cancer. Prostate  cancer. Skin cancer. Lung cancer. What should I know about heart disease, diabetes, and high blood pressure? Blood pressure and heart disease High blood pressure causes heart disease and increases the risk of stroke. This is more likely to develop in people who have high blood pressure readings or are overweight. Talk with your health care provider about your target blood pressure readings. Have your blood pressure checked: Every 3-5 years if you are 11-32 years of age. Every year if you are 39 years old or older. If you are between the ages of 2 and 48 and are a current or former smoker, ask your health care provider if you should have a one-time screening for abdominal aortic aneurysm (AAA). Diabetes Have regular diabetes screenings. This checks your fasting blood sugar level. Have the screening done: Once every three years after age 58 if you are at a normal weight and have a low risk for diabetes. More often and at a younger age if you are overweight or have a high risk for diabetes. What should I know about preventing infection? Hepatitis B If you have a higher risk for hepatitis B, you should be screened for this virus. Talk with your health care provider to find out if you are at risk for hepatitis B infection. Hepatitis C Blood testing is recommended for: Everyone born from [birth year] through 1964. Anyone with known risk factors for hepatitis C. Sexually transmitted infections (STIs) You should be screened each year for STIs, including gonorrhea and chlamydia, if: You are sexually active and are younger than 41 years of age. You are older than  41 years of age and your health care provider tells you that you are at risk for this type of infection. Your sexual activity has changed since you were last screened, and you are at increased risk for chlamydia or gonorrhea. Ask your health care provider if you are at risk. Ask your health care provider about whether you are at high risk for HIV.  Your health care provider may recommend a prescription medicine to help prevent HIV infection. If you choose to take medicine to prevent HIV, you should first get tested for HIV. You should then be tested every 3 months for as long as you are taking the medicine. Follow these instructions at home: Alcohol use Do not drink alcohol if your health care provider tells you not to drink. If you drink alcohol: Limit how much you have to 0-2 drinks a day. Know how much alcohol is in your drink. In the [place] one drink equals one 12 oz bottle of beer (355 mL), one 5 oz glass of wine (148 mL), or one 1 oz glass of hard liquor (44 mL). Lifestyle Do not use any products that contain nicotine or tobacco. These products include cigarettes, chewing tobacco, and vaping devices, such as e-cigarettes. If you need help quitting, ask your health care provider. Do not use street drugs. Do not share needles. Ask your health care provider for help if you need support or information about quitting drugs. General instructions Schedule regular health, dental, and eye exams. Stay current with your vaccines. Tell your health care provider if: You often feel depressed. You have ever been abused or do not feel safe at home. Summary Adopting a healthy lifestyle and getting preventive care are important in promoting health and wellness. Follow your health care provider's instructions about healthy diet, exercising, and getting tested or screened for diseases. Follow your health care provider's instructions on monitoring your cholesterol and blood pressure. This information is not intended to replace advice given to you by your health care provider. Make sure you discuss any questions you have with your health care provider. Document Revised: 05/07/2021 Document Reviewed: 05/07/2021 Elsevier Patient Education  2024 [employer].

## 2023-08-14 ENCOUNTER — Other Ambulatory Visit: Payer: Managed Care, Other (non HMO)

## 2023-08-15 ENCOUNTER — Other Ambulatory Visit (INDEPENDENT_AMBULATORY_CARE_PROVIDER_SITE_OTHER): Payer: Managed Care, Other (non HMO)

## 2023-08-15 DIAGNOSIS — E119 Type 2 diabetes mellitus without complications: Secondary | ICD-10-CM | POA: Diagnosis not present

## 2023-08-15 LAB — LIPID PANEL
Cholesterol: 214 mg/dL — ABNORMAL HIGH (ref 0–200)
HDL: 35 mg/dL — ABNORMAL LOW (ref >39.00)
LDL Cholesterol: 141 mg/dL — ABNORMAL HIGH (ref 0–99)
NonHDL: 179.4
Total CHOL/HDL Ratio: 6
Triglycerides: 193 mg/dL — ABNORMAL HIGH (ref 0.0–149.0)
VLDL: 38.6 mg/dL (ref 0.0–40.0)

## 2023-08-15 LAB — HEMOGLOBIN A1C: Hgb A1c MFr Bld: 5.9 % (ref 4.6–6.5)

## 2023-09-18 ENCOUNTER — Ambulatory Visit
Admission: EM | Admit: 2023-09-18 | Discharge: 2023-09-18 | Disposition: A | Discharge status: Home / Self Care | Payer: Managed Care, Other (non HMO) | Attending: Nurse Practitioner | Admitting: Nurse Practitioner

## 2023-09-18 DIAGNOSIS — M546 Pain in thoracic spine: Secondary | ICD-10-CM | POA: Diagnosis not present

## 2023-09-18 MED ORDER — IBUPROFEN 800 MG PO TABS: 800.0000 mg | ORAL_TABLET | Freq: Three times a day (TID) | ORAL | 0 refills | Status: DC

## 2023-09-18 MED ORDER — PREDNISONE 20 MG PO TABS: 40.0000 mg | ORAL_TABLET | Freq: Every day | ORAL | 0 refills | Status: AC

## 2023-09-18 NOTE — ED Triage Notes (Signed)
Pt c/o upper back pain located between the shoulders started Monday night while at work. Pain hasn't gotten better with the OTC meds.

## 2023-09-18 NOTE — ED Provider Notes (Signed)
[facility]    CSN: [id number] Arrival date & time: 09/18/23  0803      History   Chief Complaint No chief complaint on file.   HPI Jimmy Hancock is a 42 y.o. male.   The history is provided by the patient.   Patient presents for complaints of mid back pain that started over the last several days.  Patient denies any obvious injury or trauma.  States that the pain comes and goes.  He describes the pain as a "soreness".  Currently rates pain 5/10 at present.  Patient also reports that he does have a history of food impaction, and symptoms have been more frequent.  He denies symptoms at this time.  Patient denies fever, chills, chest pain, abdominal pain, or numbness or tingling in the upper extremities.  Patient reports that he took ibuprofen, he is unsure if it was effective.  He does have a history of diabetes.  Most recent hemoglobin A1c was 5.9 in August 2024.   Past Medical History:  Diagnosis Date   Asthma    Diabetes mellitus without complication [facility])     Patient Active Problem List   Diagnosis Date Noted   Diabetes mellitus without complication (HCC) 10/30/2022   Perirectal abscess 10/30/2022   Annual physical exam 10/21/2018   Chronic allergic rhinitis 10/21/2018   Primary osteoarthritis of both hands 10/21/2018    Past Surgical History:  Procedure Laterality Date   ESOPHAGOGASTRODUODENOSCOPY     INCISION AND DRAINAGE ABSCESS ANAL     TONSILLECTOMY     TONSILLECTOMY AND ADENOIDECTOMY         Home Medications    Prior to Admission medications   Medication Sig Start Date End Date Taking? Authorizing Provider  ibuprofen (ADVIL) 800 MG tablet Take 1 tablet (800 mg total) by mouth 3 (three) times daily. 09/18/23  Yes Banita Lehn-Warren, Sadie Haber, NP  predniSONE (DELTASONE) 20 MG tablet Take 2 tablets (40 mg total) by mouth daily with breakfast for 5 days. 09/18/23 09/23/23 Yes Atlee Kluth-Warren, Sadie Haber, NP  albuterol (VENTOLIN HFA) 108 (90 Base)  MCG/ACT inhaler Inhale 1-2 puffs into the lungs every 6 (six) hours as needed for wheezing or shortness of breath. 02/03/23   Karie Georges, MD  metFORMIN (GLUCOPHAGE) 1000 MG tablet Take 1 tablet (1,000 mg total) by mouth 2 (two) times daily. 08/11/23   Karie Georges, MD    Family History Family History  Problem Relation Age of Onset   Arthritis Mother    Hearing loss Maternal Grandfather    Heart attack Maternal Grandfather    Heart disease Maternal Grandfather     Social History Social History   Tobacco Use   Smoking status: Never   Smokeless tobacco: Never  Vaping Use   Vaping status: Never Used  Substance Use Topics   Alcohol use: Yes    Comment: occasionally    Drug use: No     Allergies   Patanase [olopatadine hcl] and Soap   Review of Systems Review of Systems Per HPI  Physical Exam Triage Vital Signs ED Triage Vitals  Encounter Vitals Group     BP 09/18/23 0810 126/86     Systolic BP Percentile --      Diastolic BP Percentile --      Pulse Rate 09/18/23 0810 90     Resp 09/18/23 0810 13     Temp 09/18/23 0810 98.2 F (36.8 C)     Temp Source 09/18/23 0810 Oral  SpO2 09/18/23 0810 98 %     Weight --      Height --      Head Circumference --      Peak Flow --      Pain Score 09/18/23 0811 6     Pain Loc --      Pain Education --      Exclude from Growth Chart --    No data found.  Updated Vital Signs BP 126/86 (BP Location: Right Arm)   Pulse 90   Temp 98.2 F (36.8 C) (Oral)   Resp 13   SpO2 98%   Visual Acuity Right Eye Distance:   Left Eye Distance:   Bilateral Distance:    Right Eye Near:   Left Eye Near:    Bilateral Near:     Physical Exam Vitals and nursing note reviewed.  Constitutional:      Appearance: Normal appearance.  HENT:     Head: Normocephalic.  Eyes:     Extraocular Movements: Extraocular movements intact.     Pupils: Pupils are equal, round, and reactive to light.  Cardiovascular:     Rate and  Rhythm: Normal rate and regular rhythm.     Pulses: Normal pulses.     Heart sounds: Normal heart sounds.  Pulmonary:     Effort: Pulmonary effort is normal. No respiratory distress.     Breath sounds: Normal breath sounds. No stridor. No wheezing, rhonchi or rales.  Chest:     Chest wall: No tenderness.  Abdominal:     General: Bowel sounds are normal.     Palpations: Abdomen is soft.     Tenderness: There is no abdominal tenderness.  Musculoskeletal:     Cervical back: Normal range of motion.     Thoracic back: Tenderness present. No swelling, edema, deformity or signs of trauma. Normal range of motion.       Back:  Skin:    General: Skin is warm and dry.  Neurological:     General: No focal deficit present.     Mental Status: He is alert and oriented to person, place, and time.  Psychiatric:        Mood and Affect: Mood normal.        Behavior: Behavior normal.      UC Treatments / Results  Labs (all labs ordered are listed, but only abnormal results are displayed) Labs Reviewed - No data to display  EKG   Radiology No results found.  Procedures Procedures (including critical care time)  Medications Ordered in UC Medications - No data to display  Initial Impression / Assessment and Plan / UC Course  I have reviewed the triage vital signs and the nursing notes.  Pertinent labs & imaging results that were available during my care of the patient were reviewed by me and considered in my medical decision making (see chart for details).  The patient is appearing, he is in no acute distress, vital signs are stable.  Will start patient on ibuprofen 800 mg for thoracic spine pain.  Prednisone 40 mg also prescribed if the ibuprofen is not effective.  Supportive care recommendations were provided and discussed with the patient to include the use of ice or heat, gentle stretching exercises, and remaining active.  Patient was given strict ER follow-up precautions.  Patient  advised to monitor his blood glucose levels if he begins the prednisone, advised to stop medication if blood glucose exceeds 300 and to follow-up with his PCP.  Patient is in agreement with this plan of care and verbalizes understanding.  All questions were answered.  Patient stable for discharge.  Final Clinical Impressions(s) / UC Diagnoses   Final diagnoses:  Midline thoracic back pain, unspecified chronicity     Discharge Instructions      Take medication as prescribed.  As discussed, begin with taking the ibuprofen.  If ibuprofen is not effective, you may switch over to the prednisone.  While taking the prednisone, make sure you are monitoring your blood glucose levels.  If glucose levels exceed 300, stop medication immediately and follow-up with your PCP. May use ice or heat.  Apply ice for pain or swelling, heat for spasm or stiffness.  Apply for 20 minutes, remove for 1 hour, repeat as much as possible. Gentle range of motion and stretching exercises. Try to remain active while symptoms persist. Go to the emergency department immediately if you experience numbness or tingling in your upper extremities, difficulty breathing, or severe pain in your chest or back. Follow-up as needed.     ED Prescriptions     Medication Sig Dispense Auth. Provider   ibuprofen (ADVIL) 800 MG tablet Take 1 tablet (800 mg total) by mouth 3 (three) times daily. 21 tablet Christino Mcglinchey-Warren, Sadie Haber, NP   predniSONE (DELTASONE) 20 MG tablet Take 2 tablets (40 mg total) by mouth daily with breakfast for 5 days. 10 tablet Dorothy Landgrebe-Warren, Sadie Haber, NP      PDMP not reviewed this encounter.   Abran Cantor, NP 09/18/23 [phone]

## 2023-09-18 NOTE — Discharge Instructions (Signed)
Take medication as prescribed.  As discussed, begin with taking the ibuprofen.  If ibuprofen is not effective, you may switch over to the prednisone.  While taking the prednisone, make sure you are monitoring your blood glucose levels.  If glucose levels exceed 300, stop medication immediately and follow-up with your PCP. May use ice or heat.  Apply ice for pain or swelling, heat for spasm or stiffness.  Apply for 20 minutes, remove for 1 hour, repeat as much as possible. Gentle range of motion and stretching exercises. Try to remain active while symptoms persist. Go to the emergency department immediately if you experience numbness or tingling in your upper extremities, difficulty breathing, or severe pain in your chest or back. Follow-up as needed.

## 2023-12-18 ENCOUNTER — Ambulatory Visit
Admission: EM | Admit: 2023-12-18 | Discharge: 2023-12-18 | Disposition: A | Discharge status: Home / Self Care | Payer: Managed Care, Other (non HMO) | Attending: Family Medicine | Admitting: Family Medicine

## 2023-12-18 ENCOUNTER — Ambulatory Visit (INDEPENDENT_AMBULATORY_CARE_PROVIDER_SITE_OTHER): Payer: Managed Care, Other (non HMO)

## 2023-12-18 ENCOUNTER — Telehealth: Payer: Self-pay

## 2023-12-18 DIAGNOSIS — J208 Acute bronchitis due to other specified organisms: Secondary | ICD-10-CM

## 2023-12-18 DIAGNOSIS — R051 Acute cough: Secondary | ICD-10-CM

## 2023-12-18 DIAGNOSIS — J4521 Mild intermittent asthma with (acute) exacerbation: Secondary | ICD-10-CM | POA: Diagnosis not present

## 2023-12-18 MED ORDER — PREDNISONE 20 MG PO TABS: 40.0000 mg | ORAL_TABLET | Freq: Every day | ORAL | 0 refills | Status: DC

## 2023-12-18 MED ORDER — PROMETHAZINE-DM 6.25-15 MG/5ML PO SYRP: 5.0000 mL | ORAL_SOLUTION | Freq: Four times a day (QID) | ORAL | 0 refills | Status: DC | PRN

## 2023-12-18 NOTE — Telephone Encounter (Signed)
Prednisone, phenergan dm sent for bronchitis. Patient notified

## 2023-12-18 NOTE — Telephone Encounter (Signed)
Called pt at providers request to inform him that his x ray was neg for pneumonia. Informed pt that provider would like to treat him with prednisone and a cough syrup. Pt verbalized understanding.

## 2023-12-18 NOTE — Discharge Instructions (Signed)
We will call once your chest x-ray comes back and let you know the findings and create a treatment plan from there

## 2023-12-18 NOTE — ED Provider Notes (Signed)
[facility]    CSN: [id number] Arrival date & time: 12/18/23  6578      History   Chief Complaint No chief complaint on file.   HPI Jimmy Hancock is a 42 y.o. male.   Presenting today with 4-day history of productive cough, headache, sore throat, congestion, chest tightness.  Denies chest pain, shortness of breath, abdominal pain, nausea vomiting or diarrhea.  So far trying over-the-counter cough syrups with minimal relief.  History of asthma on albuterol as needed.    Past Medical History:  Diagnosis Date   Asthma    Diabetes mellitus without complication [facility])     Patient Active Problem List   Diagnosis Date Noted   Diabetes mellitus without complication (HCC) 10/30/2022   Perirectal abscess 10/30/2022   Annual physical exam 10/21/2018   Chronic allergic rhinitis 10/21/2018   Primary osteoarthritis of both hands 10/21/2018    Past Surgical History:  Procedure Laterality Date   ESOPHAGOGASTRODUODENOSCOPY     INCISION AND DRAINAGE ABSCESS ANAL     TONSILLECTOMY     TONSILLECTOMY AND ADENOIDECTOMY         Home Medications    Prior to Admission medications   Medication Sig Start Date End Date Taking? Authorizing Provider  albuterol (VENTOLIN HFA) 108 (90 Base) MCG/ACT inhaler Inhale 1-2 puffs into the lungs every 6 (six) hours as needed for wheezing or shortness of breath. 02/03/23   Karie Georges, MD  ibuprofen (ADVIL) 800 MG tablet Take 1 tablet (800 mg total) by mouth 3 (three) times daily. 09/18/23   Leath-Warren, Sadie Haber, NP  metFORMIN (GLUCOPHAGE) 1000 MG tablet Take 1 tablet (1,000 mg total) by mouth 2 (two) times daily. 08/11/23   Karie Georges, MD  predniSONE (DELTASONE) 20 MG tablet Take 2 tablets (40 mg total) by mouth daily with breakfast. 12/18/23   Particia Nearing, PA-C  promethazine-dextromethorphan (PROMETHAZINE-DM) 6.25-15 MG/5ML syrup Take 5 mLs by mouth 4 (four) times daily as needed. 12/18/23   Particia Nearing, PA-C    Family History Family History  Problem Relation Age of Onset   Arthritis Mother    Hearing loss Maternal Grandfather    Heart attack Maternal Grandfather    Heart disease Maternal Grandfather     Social History Social History   Tobacco Use   Smoking status: Never   Smokeless tobacco: Never  Vaping Use   Vaping status: Never Used  Substance Use Topics   Alcohol use: Yes    Comment: occasionally    Drug use: No     Allergies   Patanase [olopatadine hcl] and Soap   Review of Systems Review of Systems Per HPI  Physical Exam Triage Vital Signs ED Triage Vitals  Encounter Vitals Group     BP 12/18/23 0822 (!) 134/91     Systolic BP Percentile --      Diastolic BP Percentile --      Pulse Rate 12/18/23 0822 95     Resp 12/18/23 0822 18     Temp 12/18/23 0822 98.9 F (37.2 C)     Temp Source 12/18/23 0822 Oral     SpO2 12/18/23 0822 95 %     Weight --      Height --      Head Circumference --      Peak Flow --      Pain Score 12/18/23 0823 3     Pain Loc --      Pain Education --  Exclude from Growth Chart --    No data found.  Updated Vital Signs BP (!) 134/91 (BP Location: Right Arm)   Pulse 95   Temp 98.9 F (37.2 C) (Oral)   Resp 18   SpO2 95%   Visual Acuity Right Eye Distance:   Left Eye Distance:   Bilateral Distance:    Right Eye Near:   Left Eye Near:    Bilateral Near:     Physical Exam Vitals and nursing note reviewed.  Constitutional:      Appearance: He is well-developed.  HENT:     Head: Atraumatic.     Right Ear: External ear normal.     Left Ear: External ear normal.     Nose: Rhinorrhea present.     Mouth/Throat:     Pharynx: Posterior oropharyngeal erythema present. No oropharyngeal exudate.  Eyes:     Conjunctiva/sclera: Conjunctivae normal.     Pupils: Pupils are equal, round, and reactive to light.  Cardiovascular:     Rate and Rhythm: Normal rate and regular rhythm.  Pulmonary:      Effort: Pulmonary effort is normal. No respiratory distress.     Breath sounds: Wheezing present. No rales.     Comments: Trace wheezes bilaterally Musculoskeletal:        General: Normal range of motion.     Cervical back: Normal range of motion and neck supple.  Lymphadenopathy:     Cervical: No cervical adenopathy.  Skin:    General: Skin is warm and dry.  Neurological:     Mental Status: He is alert and oriented to person, place, and time.  Psychiatric:        Behavior: Behavior normal.      UC Treatments / Results  Labs (all labs ordered are listed, but only abnormal results are displayed) Labs Reviewed - No data to display  EKG   Radiology DG Chest 2 View Result Date: 12/18/2023 CLINICAL DATA:  Productive cough for 4 days. EXAM: CHEST - 2 VIEW COMPARISON:  None Available. FINDINGS: The heart size and mediastinal contours are within normal limits. Both lungs are clear. The visualized skeletal structures are unremarkable. IMPRESSION: No active cardiopulmonary disease. Electronically Signed   By: Lupita Raider M.D.   On: 12/18/2023 10:44    Procedures Procedures (including critical care time)  Medications Ordered in UC Medications - No data to display  Initial Impression / Assessment and Plan / UC Course  I have reviewed the triage vital signs and the nursing notes.  Pertinent labs & imaging results that were available during my care of the patient were reviewed by me and considered in my medical decision making (see chart for details).     Overall vital signs within normal limits, chest x-ray today negative for pneumonia.  Will treat for viral bronchitis and asthma exacerbation with prednisone, Phenergan DM, albuterol as needed.  Discussed supportive care medications and home care additionally.  Work note given.  Return for worsening symptoms.  Final Clinical Impressions(s) / UC Diagnoses   Final diagnoses:  Acute cough  Viral bronchitis  Mild intermittent  asthma with acute exacerbation     Discharge Instructions      We will call once your chest x-ray comes back and let you know the findings and create a treatment plan from there    ED Prescriptions   None    PDMP not reviewed this encounter.   Particia Nearing, [place] 12/18/23 1328

## 2023-12-18 NOTE — ED Triage Notes (Signed)
Pt reports he has a cough, headache, and sore throat x 4 days

## 2023-12-25 ENCOUNTER — Ambulatory Visit
Admission: EM | Admit: 2023-12-25 | Discharge: 2023-12-25 | Disposition: A | Discharge status: Home / Self Care | Payer: Managed Care, Other (non HMO) | Attending: Nurse Practitioner | Admitting: Nurse Practitioner

## 2023-12-25 ENCOUNTER — Encounter: Payer: Self-pay | Admitting: Emergency Medicine

## 2023-12-25 ENCOUNTER — Other Ambulatory Visit: Payer: Self-pay

## 2023-12-25 DIAGNOSIS — Z8709 Personal history of other diseases of the respiratory system: Secondary | ICD-10-CM | POA: Insufficient documentation

## 2023-12-25 DIAGNOSIS — J029 Acute pharyngitis, unspecified: Secondary | ICD-10-CM | POA: Insufficient documentation

## 2023-12-25 DIAGNOSIS — J209 Acute bronchitis, unspecified: Secondary | ICD-10-CM | POA: Diagnosis present

## 2023-12-25 LAB — POCT RAPID STREP A (OFFICE): Rapid Strep A Screen: NEGATIVE

## 2023-12-25 MED ORDER — NYSTATIN 100000 UNIT/ML MT SUSP: 15.0000 mL | Freq: Four times a day (QID) | OROMUCOSAL | 0 refills | Status: DC | PRN

## 2023-12-25 NOTE — ED Triage Notes (Addendum)
Pt reports was seen for similar and dx with bronchitis. Pt reports continued cough, fatigue, decreased appetite, and sore throat.

## 2023-12-25 NOTE — Discharge Instructions (Addendum)
The rapid strep test was negative.  A throat culture has been ordered.  You will be contacted if the pending test result is abnormal. Take medication as prescribed.  Continue the medications previously prescribed for bronchitis. Increase fluids and allow for plenty of rest. Recommend warm salt water gargles 3-4 times daily as needed for throat pain or discomfort. Recommend a soft diet such as soup, broth, yogurt, pudding, Jell-O, popsicles, or applesauce while symptoms persist. The cough that you have may linger for the next 1 to 2 weeks.  Recommend increasing your fluid intake, and using cough drops to help with symptoms.  Do recommend following up in this clinic or with your primary care physician if you experience new symptoms such as fever, chills, wheezing, shortness of breath, or difficulty breathing. Follow-up as needed.

## 2023-12-25 NOTE — ED Provider Notes (Signed)
[facility]    CSN: [id number] Arrival date & time: 12/25/23  0815      History   Chief Complaint Chief Complaint  Patient presents with   Cough    HPI Jimmy Hancock is a 42 y.o. male.   The history is provided by the patient.   Patient reports for complaints of continued cough, fatigue, decreased appetite, and sore throat.  Patient reports he was diagnosed with bronchitis on 12/18/2023.  States he was prescribed prednisone, Promethazine DM, and an albuterol inhaler.  Patient states that he now has increased pain with swallowing.  States that he does not cough unless he is taking deep breaths.  Denies fever, chills, headache, ear pain, nasal congestion, runny nose, difficulty breathing, chest pain, abdominal pain, nausea, vomiting, diarrhea, or rash.  Past Medical History:  Diagnosis Date   Asthma    Diabetes mellitus without complication [facility])     Patient Active Problem List   Diagnosis Date Noted   Diabetes mellitus without complication (HCC) 10/30/2022   Perirectal abscess 10/30/2022   Annual physical exam 10/21/2018   Chronic allergic rhinitis 10/21/2018   Primary osteoarthritis of both hands 10/21/2018    Past Surgical History:  Procedure Laterality Date   ESOPHAGOGASTRODUODENOSCOPY     INCISION AND DRAINAGE ABSCESS ANAL     TONSILLECTOMY     TONSILLECTOMY AND ADENOIDECTOMY         Home Medications    Prior to Admission medications   Medication Sig Start Date End Date Taking? Authorizing Provider  magic mouthwash (nystatin, hydrocortisone, diphenhydrAMINE, lidocaine) suspension Swish and swallow 15 mLs 4 (four) times daily as needed for mouth pain. 12/25/23  Yes Leath-Warren, Sadie Haber, NP  albuterol (VENTOLIN HFA) 108 (90 Base) MCG/ACT inhaler Inhale 1-2 puffs into the lungs every 6 (six) hours as needed for wheezing or shortness of breath. 02/03/23   Karie Georges, MD  ibuprofen (ADVIL) 800 MG tablet Take 1 tablet (800 mg total)  by mouth 3 (three) times daily. 09/18/23   Leath-Warren, Sadie Haber, NP  metFORMIN (GLUCOPHAGE) 1000 MG tablet Take 1 tablet (1,000 mg total) by mouth 2 (two) times daily. 08/11/23   Karie Georges, MD  predniSONE (DELTASONE) 20 MG tablet Take 2 tablets (40 mg total) by mouth daily with breakfast. 12/18/23   Particia Nearing, PA-C  promethazine-dextromethorphan (PROMETHAZINE-DM) 6.25-15 MG/5ML syrup Take 5 mLs by mouth 4 (four) times daily as needed. 12/18/23   Particia Nearing, PA-C    Family History Family History  Problem Relation Age of Onset   Arthritis Mother    Hearing loss Maternal Grandfather    Heart attack Maternal Grandfather    Heart disease Maternal Grandfather     Social History Social History   Tobacco Use   Smoking status: Never   Smokeless tobacco: Never  Vaping Use   Vaping status: Never Used  Substance Use Topics   Alcohol use: Yes    Comment: occasionally    Drug use: No     Allergies   Patanase [olopatadine hcl] and Soap   Review of Systems Review of Systems Per HPI  Physical Exam Triage Vital Signs ED Triage Vitals  Encounter Vitals Group     BP 12/25/23 0901 114/81     Systolic BP Percentile --      Diastolic BP Percentile --      Pulse Rate 12/25/23 0901 (!) 117     Resp 12/25/23 0901 20     Temp 12/25/23  0901 99.5 F (37.5 C)     Temp Source 12/25/23 0901 Oral     SpO2 12/25/23 0901 96 %     Weight --      Height --      Head Circumference --      Peak Flow --      Pain Score 12/25/23 0904 4     Pain Loc --      Pain Education --      Exclude from Growth Chart --    No data found.  Updated Vital Signs BP 114/81 (BP Location: Right Arm)   Pulse (!) 117   Temp 99.5 F (37.5 C) (Oral)   Resp 20   SpO2 96%   Visual Acuity Right Eye Distance:   Left Eye Distance:   Bilateral Distance:    Right Eye Near:   Left Eye Near:    Bilateral Near:     Physical Exam Vitals and nursing note reviewed.   Constitutional:      General: He is not in acute distress.    Appearance: Normal appearance.  HENT:     Head: Normocephalic.     Right Ear: Tympanic membrane, ear canal and external ear normal.     Left Ear: Tympanic membrane, ear canal and external ear normal.     Nose: Congestion present.     Mouth/Throat:     Mouth: Mucous membranes are moist.     Pharynx: Posterior oropharyngeal erythema present.     Comments: Cobblestoning present to posterior oropharynx  Eyes:     Extraocular Movements: Extraocular movements intact.     Conjunctiva/sclera: Conjunctivae normal.     Pupils: Pupils are equal, round, and reactive to light.  Cardiovascular:     Rate and Rhythm: Regular rhythm. Tachycardia present.     Pulses: Normal pulses.     Heart sounds: Normal heart sounds.  Pulmonary:     Effort: Pulmonary effort is normal. No respiratory distress.     Breath sounds: Normal breath sounds. No stridor. No wheezing, rhonchi or rales.  Abdominal:     General: Bowel sounds are normal.     Palpations: Abdomen is soft.     Tenderness: There is no abdominal tenderness.  Musculoskeletal:     Cervical back: Normal range of motion.  Lymphadenopathy:     Cervical: No cervical adenopathy.  Skin:    General: Skin is warm and dry.  Neurological:     General: No focal deficit present.     Mental Status: He is alert and oriented to person, place, and time.  Psychiatric:        Mood and Affect: Mood normal.      UC Treatments / Results  Labs (all labs ordered are listed, but only abnormal results are displayed) Labs Reviewed  CULTURE, GROUP A STREP [facility])  POCT RAPID STREP A (OFFICE)    EKG   Radiology No results found.  Procedures Procedures (including critical care time)  Medications Ordered in UC Medications - No data to display  Initial Impression / Assessment and Plan / UC Course  I have reviewed the triage vital signs and the nursing notes.  Pertinent labs & imaging  results that were available during my care of the patient were reviewed by me and considered in my medical decision making (see chart for details).  On exam, lung sounds are clear throughout, room air sats at 96%.  Patient does have some throat irritation present.  Rapid strep test was  negative, throat culture is pending.  Will provide symptomatic treatment with Magic mouthwash for patient to swish and swallow.  Supportive care recommendations were provided and discussed with the patient to include over-the-counter analgesics, fluids, and a soft diet.  Patient was advised to follow-up with his PCP or in this clinic if symptoms fail to improve.  Patient was in agreement with this plan of care and verbalizes understanding.  All questions were answered.  Patient stable for discharge.  Final Clinical Impressions(s) / UC Diagnoses   Final diagnoses:  Sore throat  Acute bronchitis, unspecified organism  History of asthma     Discharge Instructions      The rapid strep test was negative.  A throat culture has been ordered.  You will be contacted if the pending test result is abnormal. Take medication as prescribed.  Continue the medications previously prescribed for bronchitis. Increase fluids and allow for plenty of rest. Recommend warm salt water gargles 3-4 times daily as needed for throat pain or discomfort. Recommend a soft diet such as soup, broth, yogurt, pudding, Jell-O, popsicles, or applesauce while symptoms persist. The cough that you have may linger for the next 1 to 2 weeks.  Recommend increasing your fluid intake, and using cough drops to help with symptoms.  Do recommend following up in this clinic or with your primary care physician if you experience new symptoms such as fever, chills, wheezing, shortness of breath, or difficulty breathing. Follow-up as needed.     ED Prescriptions     Medication Sig Dispense Auth. Provider   magic mouthwash (nystatin, hydrocortisone,  diphenhydrAMINE, lidocaine) suspension Swish and swallow 15 mLs 4 (four) times daily as needed for mouth pain. 540 mL Leath-Warren, Sadie Haber, NP      PDMP not reviewed this encounter.   Abran Cantor, NP 12/25/23 [phone]

## 2023-12-28 LAB — CULTURE, GROUP A STREP (THRC)

## 2024-02-01 ENCOUNTER — Other Ambulatory Visit: Payer: Self-pay | Admitting: Family Medicine

## 2024-02-01 DIAGNOSIS — E119 Type 2 diabetes mellitus without complications: Secondary | ICD-10-CM

## 2024-02-11 ENCOUNTER — Encounter: Payer: Self-pay | Admitting: Family Medicine

## 2024-02-11 ENCOUNTER — Ambulatory Visit (INDEPENDENT_AMBULATORY_CARE_PROVIDER_SITE_OTHER): Payer: Managed Care, Other (non HMO) | Admitting: Family Medicine

## 2024-02-11 VITALS — BP 140/90 | HR 75 | Temp 98.3°F | Ht 69.0 in | Wt 170.3 lb

## 2024-02-11 DIAGNOSIS — R6889 Other general symptoms and signs: Secondary | ICD-10-CM | POA: Diagnosis not present

## 2024-02-11 DIAGNOSIS — Z7984 Long term (current) use of oral hypoglycemic drugs: Secondary | ICD-10-CM

## 2024-02-11 DIAGNOSIS — F902 Attention-deficit hyperactivity disorder, combined type: Secondary | ICD-10-CM

## 2024-02-11 DIAGNOSIS — E119 Type 2 diabetes mellitus without complications: Secondary | ICD-10-CM

## 2024-02-11 DIAGNOSIS — Z0000 Encounter for general adult medical examination without abnormal findings: Secondary | ICD-10-CM

## 2024-02-11 DIAGNOSIS — K0889 Other specified disorders of teeth and supporting structures: Secondary | ICD-10-CM

## 2024-02-11 LAB — POCT GLYCOSYLATED HEMOGLOBIN (HGB A1C): Hemoglobin A1C: 5.4 % (ref 4.0–5.6)

## 2024-02-11 MED ORDER — BUPROPION HCL ER (XL) 150 MG PO TB24: 150.0000 mg | ORAL_TABLET | Freq: Every day | ORAL | 5 refills | Status: DC

## 2024-02-11 MED ORDER — IBUPROFEN 800 MG PO TABS: 800.0000 mg | ORAL_TABLET | Freq: Three times a day (TID) | ORAL | 0 refills | Status: DC

## 2024-02-11 NOTE — Progress Notes (Signed)
Complete physical exam  Patient: Jimmy Hancock   DOB: 09-Jan-1981   43 y.o. Male  MRN: [id number]  Subjective:    Chief Complaint  Patient presents with   Medical Management of Chronic Issues    Jimmy Hancock is a 43 y.o. male who presents today for a complete physical exam. He reports consuming a general and low sugar  diet. The patient has a physically strenuous job, but has no regular exercise apart from work.  He generally feels well. He reports sleeping fairly well. He does not have additional problems to discuss today.   Patient would like to discuss getting back on his ADHD medication, states that he was diagnosed when he was a child, states that he hasn't been on medication since before college. He reports that he is having trouble with his memory, states that he is very forgetful with details, pt reports that he has had to develop other skills in order to stay organized. States that he isn't fidgeting or needing to move around, does talk a lot. States that he has some fixed interests, states that if it is a task that he is interested in he is able to complete, but if it is something that he is not interested in he has trouble finishing.   Most recent fall risk assessment:    10/21/2018    8:18 AM  Fall Risk   Falls in the past year? No     Most recent depression screenings:    08/11/2023    7:57 AM 02/03/2023    9:17 AM  PHQ 2/9 Scores  PHQ - 2 Score 2 1  PHQ- 9 Score 6 5    Vision:Within last year and Dental: Current dental problems and Last dental visit: has appt tomorrow for a painful tooth he is having today.  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   metFORMIN (GLUCOPHAGE) 1000 MG tablet TAKE (1) TABLET BY MOUTH TWICE DAILY.   [DISCONTINUED] ibuprofen (ADVIL) 800 MG tablet Take 1 tablet (800 mg total) by mouth 3 (three) times daily.   [DISCONTINUED] magic mouthwash (nystatin, hydrocortisone, diphenhydrAMINE, lidocaine) suspension Swish and swallow 15 mLs 4 (four) times daily as needed for mouth pain.   [DISCONTINUED] predniSONE (DELTASONE) 20 MG tablet Take 2 tablets (40 mg total) by mouth daily with breakfast.   [DISCONTINUED] promethazine-dextromethorphan (PROMETHAZINE-DM) 6.25-15 MG/5ML syrup Take 5 mLs by mouth 4 (four) times daily as needed.   No facility-administered medications prior to visit.    Review of Systems  HENT:  Negative for hearing loss.   Eyes:  Negative for blurred vision.  Respiratory:  Negative for shortness of breath.   Cardiovascular:  Negative for chest pain.  Gastrointestinal: Negative.   Genitourinary: Negative.   Musculoskeletal:  Negative for back pain.  Neurological:  Negative for headaches.  Psychiatric/Behavioral:  Negative for depression.   All other systems reviewed and are  negative.      Objective:     BP (!) 140/90   Pulse 75   Temp 98.3 F (36.8 C) (Oral)   Ht 5\' 9"  (1.753 m)   Wt 170 lb 4.8 oz (77.2 kg)   SpO2 98%   BMI 25.15 kg/m  BP Readings from Last 3 Encounters:  02/11/24 (!) 140/90  12/25/23 114/81  12/18/23 (!) 134/91      Physical Exam Vitals reviewed.  Constitutional:      Appearance: Normal appearance. He is well-groomed and normal weight.  HENT:     Right Ear: Tympanic membrane and ear canal normal.     Left Ear: Tympanic membrane and ear canal normal.      Mouth/Throat:     Mouth: Mucous membranes are moist.     Pharynx: No posterior oropharyngeal erythema.  Eyes:     Extraocular Movements: Extraocular movements intact.     Conjunctiva/sclera: Conjunctivae normal.  Neck:     Thyroid: No thyromegaly.  Cardiovascular:     Rate and Rhythm: Normal rate and regular rhythm.     Heart sounds: S1 normal and S2 normal. No murmur heard. Pulmonary:     Effort: Pulmonary effort is normal.     Breath sounds: Normal breath sounds and air entry. No rales.  Abdominal:     General: Abdomen is flat. Bowel sounds are normal.  Musculoskeletal:     Right lower leg: No edema.     Left lower leg: No edema.  Lymphadenopathy:     Cervical: No cervical adenopathy.  Neurological:     General: No focal deficit present.     Mental Status: He is alert and oriented to person, place, and time.     Gait: Gait is intact.  Psychiatric:        Mood and Affect: Mood and affect normal.      Results for orders placed or performed in visit on 02/11/24  POC HgB A1c  Result Value Ref Range   Hemoglobin A1C 5.4 4.0 - 5.6 %   HbA1c POC (<> result, manual entry)     HbA1c, POC (prediabetic range)     HbA1c, POC (controlled diabetic range)     Last lipids Lab Results  Component Value Date   CHOL 214 (H) 08/15/2023   HDL 35.00 (L) 08/15/2023   LDLCALC 141 (H) 08/15/2023   LDLDIRECT 132.0 02/03/2023   TRIG 193.0 (H) 08/15/2023   CHOLHDL 6 08/15/2023        Assessment & Plan:    Routine Health Maintenance and Physical Exam  Immunization History  Administered Date(s) Administered   Influenza,inj,Quad PF,6+ Mos 10/21/2018   Influenza-Unspecified 10/16/2022   Tdap 10/21/2018    Health Maintenance  Topic Date Due   Pneumococcal Vaccine 79-61 Years old (1 of 2 - PCV) Never done   FOOT EXAM  Never done   OPHTHALMOLOGY EXAM  Never done   Hepatitis C Screening  Never done   INFLUENZA VACCINE  07/31/2023   COVID-19 Vaccine (1 - 2024-25 season) Never done    Diabetic kidney evaluation - eGFR measurement  11/13/2023   Diabetic kidney evaluation - Urine ACR  11/13/2023   HEMOGLOBIN A1C  08/10/2024   DTaP/Tdap/Td (2 - Td or Tdap) 10/21/2028   HIV Screening  Completed   HPV VACCINES  Aged Out    Discussed health benefits of physical activity, and encouraged him to engage in regular exercise appropriate for his age and condition.  Routine general medical examination  at a health care facility  Diabetes mellitus without complication (HCC) -     POCT glycosylated hemoglobin (Hb A1C) -     Comprehensive metabolic panel; Future -     Lipid panel; Future -     Microalbumin / creatinine urine ratio; Future  Cold intolerance -     TSH; Future  ADHD (attention deficit hyperactivity disorder), combined type -     buPROPion HCl ER (XL); Take 1 tablet (150 mg total) by mouth daily.  Dispense: 30 tablet; Refill: 5  Tooth pain -     Ibuprofen; Take 1 tablet (800 mg total) by mouth 3 (three) times daily.  Dispense: 21 tablet; Refill: 0  Normal physical exam findings today, we did address the patient's concern about his ADHD symptoms, I counseled the patient on healthy sleep habits verbally and handouts were given on healthy eating and exercise. Labs ordered for annual surveillance as well as DM managemnet. He is also reporting cold intolerance. Will check TSH for this.   Return in about 6 months (around 08/10/2024) for DM.     Karie Georges, MD

## 2024-02-12 ENCOUNTER — Other Ambulatory Visit: Payer: Managed Care, Other (non HMO)

## 2024-02-16 ENCOUNTER — Encounter: Payer: Self-pay | Admitting: Family Medicine

## 2024-02-16 ENCOUNTER — Other Ambulatory Visit (INDEPENDENT_AMBULATORY_CARE_PROVIDER_SITE_OTHER): Payer: Managed Care, Other (non HMO)

## 2024-02-16 DIAGNOSIS — R6889 Other general symptoms and signs: Secondary | ICD-10-CM | POA: Diagnosis not present

## 2024-02-16 DIAGNOSIS — E119 Type 2 diabetes mellitus without complications: Secondary | ICD-10-CM

## 2024-02-16 LAB — LIPID PANEL
Cholesterol: 200 mg/dL (ref 0–200)
HDL: 38 mg/dL — ABNORMAL LOW (ref >39.00)
LDL Cholesterol: 134 mg/dL — ABNORMAL HIGH (ref 0–99)
NonHDL: 162.04
Total CHOL/HDL Ratio: 5
Triglycerides: 139 mg/dL (ref 0.0–149.0)
VLDL: 27.8 mg/dL (ref 0.0–40.0)

## 2024-02-16 LAB — COMPREHENSIVE METABOLIC PANEL
ALT: 28 U/L (ref 0–53)
AST: 22 U/L (ref 0–37)
Albumin: 5.1 g/dL (ref 3.5–5.2)
Alkaline Phosphatase: 85 U/L (ref 39–117)
BUN: 12 mg/dL (ref 6–23)
CO2: 30 meq/L (ref 19–32)
Calcium: 10.1 mg/dL (ref 8.4–10.5)
Chloride: 99 meq/L (ref 96–112)
Creatinine, Ser: 0.98 mg/dL (ref 0.40–1.50)
GFR: 95.98 mL/min (ref >60.00)
Glucose, Bld: 89 mg/dL (ref 70–99)
Potassium: 4.2 meq/L (ref 3.5–5.1)
Sodium: 140 meq/L (ref 135–145)
Total Bilirubin: 0.4 mg/dL (ref 0.2–1.2)
Total Protein: 7.6 g/dL (ref 6.0–8.3)

## 2024-02-16 LAB — MICROALBUMIN / CREATININE URINE RATIO
Creatinine,U: 74.3 mg/dL
Microalb Creat Ratio: 65.4 mg/g — ABNORMAL HIGH (ref 0.0–30.0)
Microalb, Ur: 4.9 mg/dL — ABNORMAL HIGH (ref 0.0–1.9)

## 2024-02-16 LAB — TSH: TSH: 0.87 u[IU]/mL (ref 0.35–5.50)

## 2024-03-11 ENCOUNTER — Encounter: Payer: Self-pay | Admitting: Family Medicine

## 2024-04-23 ENCOUNTER — Encounter: Payer: Self-pay | Admitting: Family Medicine

## 2024-05-05 ENCOUNTER — Ambulatory Visit (INDEPENDENT_AMBULATORY_CARE_PROVIDER_SITE_OTHER): Admitting: Family Medicine

## 2024-05-05 ENCOUNTER — Encounter: Payer: Self-pay | Admitting: Family Medicine

## 2024-05-05 VITALS — BP 110/70 | HR 100 | Temp 98.7°F | Ht 69.0 in | Wt 172.6 lb

## 2024-05-05 DIAGNOSIS — M79672 Pain in left foot: Secondary | ICD-10-CM

## 2024-05-05 DIAGNOSIS — F902 Attention-deficit hyperactivity disorder, combined type: Secondary | ICD-10-CM

## 2024-05-05 DIAGNOSIS — L0231 Cutaneous abscess of buttock: Secondary | ICD-10-CM | POA: Diagnosis not present

## 2024-05-05 DIAGNOSIS — E119 Type 2 diabetes mellitus without complications: Secondary | ICD-10-CM

## 2024-05-05 MED ORDER — BUPROPION HCL ER (XL) 300 MG PO TB24
300.0000 mg | ORAL_TABLET | Freq: Every day | ORAL | 1 refills | Status: DC
Start: 2024-05-05 — End: 2024-05-13

## 2024-05-05 NOTE — Progress Notes (Signed)
 Established Patient Office Visit  Subjective   Patient ID: Jimmy Hancock, male    DOB: 1981-01-13  Age: 43 y.o. MRN: [id number]  Chief Complaint  Patient presents with   Pain    Patient complains of left lateral foot pain x6 days, no known injury   Abscess    Patient complains of rectal abscess and pain    Pt states that the foot pain started about 6 days ago, states that it came on all of a sudden, states that it is getting better, started improving the last day or two. States it was worse with weight bearing, has not taken anything at home for the pain. I recommended OTC ibuprofen  400-800 mg every 8 hours PRN.   Pt also is reporting a possible new abscess in the rectal area. States that he started taking hot baths and this was helping the pain. Doesn't think there is an open wound. States that after the first few days it stopped hurting but can feel that it is still there, no fever or chills, states he is worried that the previous abscess will come back.  ADHD -- pt reports that he thinks the wellbutrin  is helping, although he still has a lot of disorganization and difficulty focusing. No side effects reported to the medication. We discussed increasing it to 300 mg and he is agreeable     Current Outpatient Medications  Medication Instructions   albuterol  (VENTOLIN  HFA) 108 (90 Base) MCG/ACT inhaler 1-2 puffs, Inhalation, Every 6 hours PRN   buPROPion  (WELLBUTRIN  XL) 300 mg, Oral, Daily   metFORMIN  (GLUCOPHAGE ) 1000 MG tablet TAKE (1) TABLET BY MOUTH TWICE DAILY.    Patient Active Problem List   Diagnosis Date Noted   ADHD (attention deficit hyperactivity disorder), combined type 02/11/2024   Diabetes mellitus without complication (HCC) 10/30/2022   Perirectal abscess 10/30/2022   Annual physical exam 10/21/2018   Chronic allergic rhinitis 10/21/2018   Primary osteoarthritis of both hands 10/21/2018      Review of Systems  All other systems reviewed and are  negative.     Objective:     BP 110/70   Pulse 100   Temp 98.7 F (37.1 C) (Oral)   Ht 5\' 9"  (1.753 m)   Wt 172 lb 9.6 oz (78.3 kg)   SpO2 98%   BMI 25.49 kg/m    Physical Exam Vitals reviewed.  Constitutional:      Appearance: Normal appearance. He is normal weight.  Pulmonary:     Effort: Pulmonary effort is normal.  Genitourinary:    Rectum: Normal. No mass or anal fissure.  Musculoskeletal:     Left foot: Normal. Normal range of motion. No swelling or deformity.  Neurological:     Mental Status: He is alert and oriented to person, place, and time. Mental status is at baseline.  Psychiatric:        Mood and Affect: Mood normal.        Behavior: Behavior normal.      No results found for any visits on 05/05/24.    The 10-year ASCVD risk score (Arnett DK, et al., 2019) is: 2.7%    Assessment & Plan:  Left foot pain  Foot exam is unremarkable, could be tendonitis vs muscle strain, continue PRN ibuprofen  Cutaneous abscess of buttock None seen on exam today, reassured patient.  ADHD (attention deficit hyperactivity disorder), combined type Assessment & Plan: Will increase wellbutrin  to 300 mg daily to help with his symptoms. Will see  him back in 3 months for follow up  Orders: -     buPROPion  HCl ER (XL); Take 1 tablet (300 mg total) by mouth daily.  Dispense: 90 tablet; Refill: 1       Return in about 3 months (around 08/05/2024) for DM.    Aida House, MD

## 2024-05-09 NOTE — Assessment & Plan Note (Signed)
 Will increase wellbutrin  to 300 mg daily to help with his symptoms. Will see him back in 3 months for follow up

## 2024-05-13 ENCOUNTER — Encounter: Payer: Self-pay | Admitting: Family Medicine

## 2024-05-13 DIAGNOSIS — F902 Attention-deficit hyperactivity disorder, combined type: Secondary | ICD-10-CM

## 2024-05-13 DIAGNOSIS — E119 Type 2 diabetes mellitus without complications: Secondary | ICD-10-CM

## 2024-05-13 MED ORDER — METFORMIN HCL 1000 MG PO TABS: ORAL_TABLET | ORAL | 5 refills | Status: DC

## 2024-05-13 MED ORDER — BUPROPION HCL ER (XL) 300 MG PO TB24: 300.0000 mg | ORAL_TABLET | Freq: Every day | ORAL | 5 refills | Status: DC

## 2024-05-17 MED ORDER — BUPROPION HCL ER (XL) 300 MG PO TB24: 300.0000 mg | ORAL_TABLET | Freq: Every day | ORAL | 1 refills | Status: DC

## 2024-05-17 MED ORDER — METFORMIN HCL 1000 MG PO TABS: ORAL_TABLET | ORAL | 1 refills | Status: DC

## 2024-05-17 NOTE — Addendum Note (Signed)
 Addended by: Henry Loge on: 05/17/2024 09:29 AM   Modules accepted: Orders

## 2024-08-05 ENCOUNTER — Ambulatory Visit: Admitting: Family Medicine

## 2024-08-05 ENCOUNTER — Encounter: Payer: Self-pay | Admitting: Family Medicine

## 2024-08-05 VITALS — BP 120/82 | HR 89 | Temp 98.3°F | Ht 69.0 in | Wt 173.8 lb

## 2024-08-05 DIAGNOSIS — F902 Attention-deficit hyperactivity disorder, combined type: Secondary | ICD-10-CM | POA: Diagnosis not present

## 2024-08-05 DIAGNOSIS — Z7984 Long term (current) use of oral hypoglycemic drugs: Secondary | ICD-10-CM

## 2024-08-05 DIAGNOSIS — E119 Type 2 diabetes mellitus without complications: Secondary | ICD-10-CM

## 2024-08-05 LAB — POCT GLYCOSYLATED HEMOGLOBIN (HGB A1C): Hemoglobin A1C: 5.8 % — AB (ref 4.0–5.6)

## 2024-08-05 NOTE — Progress Notes (Signed)
 Established Patient Office Visit  Subjective   Patient ID: Jimmy Hancock, male    DOB: 23-Jul-1981  Age: 43 y.o. MRN: [id number]  Chief Complaint  Patient presents with   Medical Management of Chronic Issues    Pt is here for follow up on his diabetes. Jimmy Hancock reports that Jimmy Hancock hasn't been as strict with his diet as before. States that food is more expensive and cheaper food tends to have more carbs in it. States that Jimmy Hancock had some car trouble and missed a couple of days at work and money has been tight recently.   ADHD-- pt reports that the wellbutrin  higher dose seems to be working for him. Jimmy Hancock has noticed a higher heart rate and maybe more trouble sleeping, but overall Jimmy Hancock seems to think that it is working better for him.     Current Outpatient Medications  Medication Instructions   albuterol  (VENTOLIN  HFA) 108 (90 Base) MCG/ACT inhaler 1-2 puffs, Inhalation, Every 6 hours PRN   buPROPion  (WELLBUTRIN  XL) 300 mg, Oral, Daily   metFORMIN  (GLUCOPHAGE ) 1000 MG tablet TAKE (1) TABLET BY MOUTH TWICE DAILY.    Patient Active Problem List   Diagnosis Date Noted   ADHD (attention deficit hyperactivity disorder), combined type 02/11/2024   Diabetes mellitus without complication (HCC) 10/30/2022   Perirectal abscess 10/30/2022   Annual physical exam 10/21/2018   Chronic allergic rhinitis 10/21/2018   Primary osteoarthritis of both hands 10/21/2018      Review of Systems  All other systems reviewed and are negative.     Objective:     BP 120/82   Pulse 89   Temp 98.3 F (36.8 C) (Oral)   Ht 5' 9 (1.753 m)   Wt 173 lb 12.8 oz (78.8 kg)   SpO2 98%   BMI 25.67 kg/m    Physical Exam Vitals reviewed.  Constitutional:      Appearance: Normal appearance. Jimmy Hancock is well-groomed and normal weight.  Cardiovascular:     Rate and Rhythm: Normal rate and regular rhythm.     Heart sounds: S1 normal and S2 normal. No murmur heard. Pulmonary:     Effort: Pulmonary effort is normal.      Breath sounds: Normal breath sounds and air entry. No rales.  Musculoskeletal:     Right lower leg: No edema.     Left lower leg: No edema.  Neurological:     General: No focal deficit present.     Mental Status: Jimmy Hancock is alert and oriented to person, place, and time.     Gait: Gait is intact.  Psychiatric:        Mood and Affect: Mood and affect normal.        Behavior: Behavior normal.      Results for orders placed or performed in visit on 08/05/24  POC HgB A1c  Result Value Ref Range   Hemoglobin A1C 5.8 (A) 4.0 - 5.6 %   HbA1c POC (<> result, manual entry)     HbA1c, POC (prediabetic range)     HbA1c, POC (controlled diabetic range)        The 10-year ASCVD risk score (Arnett DK, et al., 2019) is: 3.2%    Assessment & Plan:  Diabetes mellitus without complication (HCC) Assessment & Plan: A1C is well controlled on the metformin  1000 mg BID, will continue this as prescribed. Pt reminded that Jimmy Hancock needs his annual eye exam, pt states Jimmy Hancock will schedule.   Orders: -     POCT  glycosylated hemoglobin (Hb A1C) -     Collection capillary blood specimen  ADHD (attention deficit hyperactivity disorder), combined type Assessment & Plan: The increase in wellbutrin  to 300 mg daily has lead to a mild amount of sleep disturbance and higher heart rates. I counseled the patient on this and offered to change him to the SR dosing, however Jimmy Hancock wants to wait since Jimmy Hancock reports that his symptoms are better controlled.     Two chronic medical problems addressed/evaluated this visit  Return in about 6 months (around 02/16/2025) for annual physical exam.    Heron CHRISTELLA Sharper, MD

## 2024-08-08 NOTE — Assessment & Plan Note (Signed)
 The increase in wellbutrin  to 300 mg daily has lead to a mild amount of sleep disturbance and higher heart rates. I counseled the patient on this and offered to change him to the SR dosing, however he wants to wait since he reports that his symptoms are better controlled.

## 2024-08-08 NOTE — Assessment & Plan Note (Signed)
 A1C is well controlled on the metformin  1000 mg BID, will continue this as prescribed. Pt reminded that he needs his annual eye exam, pt states he will schedule.

## 2024-10-17 ENCOUNTER — Other Ambulatory Visit: Payer: Self-pay | Admitting: Family Medicine

## 2024-10-17 DIAGNOSIS — F902 Attention-deficit hyperactivity disorder, combined type: Secondary | ICD-10-CM

## 2024-10-17 DIAGNOSIS — E119 Type 2 diabetes mellitus without complications: Secondary | ICD-10-CM

## 2024-10-26 ENCOUNTER — Ambulatory Visit
Admission: EM | Admit: 2024-10-26 | Discharge: 2024-10-26 | Disposition: A | Discharge status: Home / Self Care | Attending: Family Medicine | Admitting: Family Medicine

## 2024-10-26 DIAGNOSIS — J069 Acute upper respiratory infection, unspecified: Secondary | ICD-10-CM

## 2024-10-26 DIAGNOSIS — J4521 Mild intermittent asthma with (acute) exacerbation: Secondary | ICD-10-CM

## 2024-10-26 DIAGNOSIS — J4520 Mild intermittent asthma, uncomplicated: Secondary | ICD-10-CM

## 2024-10-26 LAB — POC SOFIA SARS ANTIGEN FIA: SARS Coronavirus 2 Ag: NEGATIVE

## 2024-10-26 MED ORDER — PROMETHAZINE-DM 6.25-15 MG/5ML PO SYRP: 5.0000 mL | ORAL_SOLUTION | Freq: Four times a day (QID) | ORAL | 0 refills | Status: DC | PRN

## 2024-10-26 MED ORDER — ALBUTEROL SULFATE HFA 108 (90 BASE) MCG/ACT IN AERS: 2.0000 | INHALATION_SPRAY | RESPIRATORY_TRACT | 0 refills | Status: AC | PRN

## 2024-10-26 MED ORDER — AZELASTINE HCL 0.1 % NA SOLN: 1.0000 | Freq: Two times a day (BID) | NASAL | 0 refills | Status: DC

## 2024-10-26 NOTE — ED Triage Notes (Signed)
 Pt reports he has a cough, sore throat, difficulty sleeping, and headache x 3 days   Used cough drops which gave some relief

## 2024-10-29 NOTE — ED Provider Notes (Signed)
 [facility]    CSN: [id number] Arrival date & time: 10/26/24  9185      History   Chief Complaint No chief complaint on file.   HPI Jimmy Hancock is a 43 y.o. male.   Patient presenting today with 3-day history of cough, sore throat, headache, fatigue, congestion.  Denies fever, chest pain, shortness of breath, abdominal pain, vomiting, diarrhea.  So far using cough drops with minimal relief.  History of asthma not currently on any inhalers.    Past Medical History:  Diagnosis Date   Asthma    Diabetes mellitus without complication [facility])     Patient Active Problem List   Diagnosis Date Noted   ADHD (attention deficit hyperactivity disorder), combined type 02/11/2024   Diabetes mellitus without complication (HCC) 10/30/2022   Perirectal abscess 10/30/2022   Annual physical exam 10/21/2018   Chronic allergic rhinitis 10/21/2018   Primary osteoarthritis of both hands 10/21/2018    Past Surgical History:  Procedure Laterality Date   ESOPHAGOGASTRODUODENOSCOPY     INCISION AND DRAINAGE ABSCESS ANAL     TONSILLECTOMY     TONSILLECTOMY AND ADENOIDECTOMY         Home Medications    Prior to Admission medications   Medication Sig Start Date End Date Taking? Authorizing Provider  azelastine (ASTELIN) 0.1 % nasal spray Place 1 spray into both nostrils 2 (two) times daily. Use in each nostril as directed 10/26/24  Yes Stuart Vernell Norris, PA-C  promethazine -dextromethorphan (PROMETHAZINE -DM) 6.25-15 MG/5ML syrup Take 5 mLs by mouth 4 (four) times daily as needed. 10/26/24  Yes Stuart Vernell Norris, PA-C  albuterol  (VENTOLIN  HFA) 108 [id number] Base) MCG/ACT inhaler Inhale 2 puffs into the lungs every 4 (four) hours as needed for wheezing or shortness of breath. 10/26/24   Stuart Vernell Norris, PA-C  buPROPion  (WELLBUTRIN  XL) 300 MG 24 hr tablet TAKE 1 TABLET BY MOUTH DAILY 10/18/24   Ozell Heron HERO, MD  metFORMIN  (GLUCOPHAGE ) 1000 MG tablet TAKE  TABLET BY MOUTH TWICE DAILY 10/18/24   Ozell Heron HERO, MD    Family History Family History  Problem Relation Age of Onset   Arthritis Mother    Hearing loss Maternal Grandfather    Heart attack Maternal Grandfather    Heart disease Maternal Grandfather     Social History Social History   Tobacco Use   Smoking status: Never   Smokeless tobacco: Never  Vaping Use   Vaping status: Never Used  Substance Use Topics   Alcohol use: Yes    Comment: occasionally    Drug use: No     Allergies   Patanase [olopatadine hcl] and Soap   Review of Systems Review of Systems Per HPI  Physical Exam Triage Vital Signs ED Triage Vitals  Encounter Vitals Group     BP 10/26/24 0827 (!) 136/93     Girls Systolic BP Percentile --      Girls Diastolic BP Percentile --      Boys Systolic BP Percentile --      Boys Diastolic BP Percentile --      Pulse Rate 10/26/24 0827 89     Resp 10/26/24 0827 18     Temp 10/26/24 0827 98.1 F (36.7 C)     Temp Source 10/26/24 0827 Oral     SpO2 10/26/24 0827 97 %     Weight --      Height --      Head Circumference --      Peak  Flow --      Pain Score 10/26/24 0826 0     Pain Loc --      Pain Education --      Exclude from Growth Chart --    No data found.  Updated Vital Signs BP (!) 136/93 (BP Location: Right Arm)   Pulse 89   Temp 98.1 F (36.7 C) (Oral)   Resp 18   SpO2 97%   Visual Acuity Right Eye Distance:   Left Eye Distance:   Bilateral Distance:    Right Eye Near:   Left Eye Near:    Bilateral Near:     Physical Exam Vitals and nursing note reviewed.  Constitutional:      Appearance: He is well-developed.  HENT:     Head: Atraumatic.     Right Ear: External ear normal.     Left Ear: External ear normal.     Nose: Rhinorrhea present.     Mouth/Throat:     Pharynx: Posterior oropharyngeal erythema present. No oropharyngeal exudate.  Eyes:     Conjunctiva/sclera: Conjunctivae normal.     Pupils: Pupils are  equal, round, and reactive to light.  Cardiovascular:     Rate and Rhythm: Normal rate and regular rhythm.  Pulmonary:     Effort: Pulmonary effort is normal. No respiratory distress.     Breath sounds: No wheezing or rales.  Musculoskeletal:        General: Normal range of motion.     Cervical back: Normal range of motion and neck supple.  Lymphadenopathy:     Cervical: No cervical adenopathy.  Skin:    General: Skin is warm and dry.  Neurological:     Mental Status: He is alert and oriented to person, place, and time.  Psychiatric:        Behavior: Behavior normal.      UC Treatments / Results  Labs (all labs ordered are listed, but only abnormal results are displayed) Labs Reviewed  POC SOFIA SARS ANTIGEN FIA    EKG   Radiology No results found.  Procedures Procedures (including critical care time)  Medications Ordered in UC Medications - No data to display  Initial Impression / Assessment and Plan / UC Course  I have reviewed the triage vital signs and the nursing notes.  Pertinent labs & imaging results that were available during my care of the patient were reviewed by me and considered in my medical decision making (see chart for details).     Vital signs and exam reassuring today, suspect viral respiratory infection causing a mild asthma exacerbation.  Rapid COVID-negative, will treat with albuterol , Phenergan  DM, Astelin, supportive over-the-counter medications and home care.  Return for worsening symptoms.  Final Clinical Impressions(s) / UC Diagnoses   Final diagnoses:  Viral URI with cough  Mild intermittent asthma with acute exacerbation   Discharge Instructions   None    ED Prescriptions     Medication Sig Dispense Auth. Provider   albuterol  (VENTOLIN  HFA) 108 (90 Base) MCG/ACT inhaler Inhale 2 puffs into the lungs every 4 (four) hours as needed for wheezing or shortness of breath. 18 g Stuart Vernell Norris, PA-C    promethazine -dextromethorphan (PROMETHAZINE -DM) 6.25-15 MG/5ML syrup Take 5 mLs by mouth 4 (four) times daily as needed. 100 mL Stuart Vernell Norris, PA-C   azelastine (ASTELIN) 0.1 % nasal spray Place 1 spray into both nostrils 2 (two) times daily. Use in each nostril as directed 30 mL Stuart Vernell Norris, PA-C  PDMP not reviewed this encounter.   Stuart Vernell Norris, [place] 10/29/24 1104

## 2024-11-22 ENCOUNTER — Ambulatory Visit: Admitting: Family Medicine

## 2024-11-22 ENCOUNTER — Encounter: Payer: Self-pay | Admitting: Family Medicine

## 2024-11-22 VITALS — BP 130/88 | HR 100 | Temp 98.4°F | Ht 69.0 in | Wt 175.2 lb

## 2024-11-22 DIAGNOSIS — J209 Acute bronchitis, unspecified: Secondary | ICD-10-CM | POA: Diagnosis not present

## 2024-11-22 DIAGNOSIS — R051 Acute cough: Secondary | ICD-10-CM | POA: Diagnosis not present

## 2024-11-22 MED ORDER — METHYLPREDNISOLONE 4 MG PO TBPK
ORAL_TABLET | ORAL | 0 refills | Status: AC
Start: 2024-11-22 — End: ?

## 2024-11-22 MED ORDER — PROMETHAZINE-DM 6.25-15 MG/5ML PO SYRP: 5.0000 mL | ORAL_SOLUTION | Freq: Four times a day (QID) | ORAL | 0 refills | Status: AC | PRN

## 2024-11-22 NOTE — Progress Notes (Signed)
 Acute Office Visit  Subjective:     Patient ID: Jimmy Hancock, male    DOB: 10/17/1981, 43 y.o.   MRN: [id number]  Chief Complaint  Patient presents with   Cough    Non-productive intermittent cough x1 month, worse past 3-4 days, seen at an urgent care a few weeks ago, given Rx for Promethazine     Cough This is a new problem. The current episode started 1 to 4 weeks ago. The problem has been unchanged. The problem occurs constantly. The cough is Non-productive. Pertinent negatives include no chest pain, chills, ear congestion, ear pain, fever, nasal congestion, postnasal drip or wheezing. Nothing aggravates the symptoms. He has tried prescription cough suppressant for the symptoms. The treatment provided no relief. His past medical history is significant for asthma (exercise).   Discussed the use of AI scribe software for clinical note transcription with the patient, who gave verbal consent to proceed.  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  He has experienced a persistent cough for about a month, initially severe but now mild to moderate. The cough sometimes leads to shortness of breath and chest soreness, likely due to the physical strain of coughing. His heart rate is elevated at 100 bpm, compared to his usual 75-80 bpm. He notes increased mucus production but is unsure of its color. He has exercise-induced asthma and possesses an inhaler, which he rarely uses outside of physical exertion. He was prescribed an albuterol  inhaler and promethazine  cough syrup at urgent care. He has not used the albuterol  inhaler yet, and the cough syrup did not provide significant relief. He reports no fever or chills.       Review of Systems  Constitutional:  Negative for chills and fever.  HENT:  Negative for ear pain and postnasal drip.   Respiratory:  Positive for cough.  Negative for wheezing.   Cardiovascular:  Negative for chest pain.        Objective:    BP 130/88   Pulse 100   Temp 98.4 F (36.9 C) (Oral)   Ht 5' 9 (1.753 m)   Wt 175 lb 3.2 oz (79.5 kg)   SpO2 99%   BMI 25.87 kg/m    Physical Exam Vitals reviewed.  Constitutional:      Appearance: Normal appearance. He is normal weight.  HENT:     Right Ear: Tympanic membrane normal.     Left Ear: Tympanic membrane normal.     Nose: No congestion.     Mouth/Throat:     Mouth: Mucous membranes are moist.     Pharynx: No posterior oropharyngeal erythema.  Cardiovascular:     Rate and Rhythm: Normal rate and regular rhythm.     Heart sounds: Normal heart sounds.  Pulmonary:     Effort: Pulmonary effort is normal.     Breath sounds: Normal breath sounds. No wheezing.  Lymphadenopathy:     Cervical: No cervical adenopathy.  Neurological:     Mental Status: He is alert and oriented to person, place, and time. Mental status is at baseline.     No results found for any visits on 11/22/24.      Assessment & Plan:   Problem List Items Addressed This Visit   None Visit Diagnoses       Acute cough    -  Primary   Relevant Medications   methylPREDNISolone  (MEDROL   DOSEPAK) 4 MG TBPK tablet   promethazine -dextromethorphan (PROMETHAZINE -DM) 6.25-15 MG/5ML syrup   Other Relevant Orders   DG Chest 2 View     Acute bronchitis, unspecified organism       Relevant Medications   methylPREDNISolone  (MEDROL  DOSEPAK) 4 MG TBPK tablet   promethazine -dextromethorphan (PROMETHAZINE -DM) 6.25-15 MG/5ML syrup   Other Relevant Orders   DG Chest 2 View     Assessment and Plan    Acute bronchitis with acute cough Cough persisting for about a month, initially severe, now mild to moderate. No fever, chills, or significant nasal congestion. Increased heart rate noted. No wheezing or significant mucus production. Likely tracheobronchitis affecting larger airways. No signs of bacterial infection,  thus antibiotics not indicated. Steroids considered to reduce airway inflammation. Albuterol  inhaler recommended to open airways, with potential side effects of increased heart rate and jitteriness. Chest x-ray considered to rule out bronchial wall thickening or other abnormalities. - Prescribed a course of steroids to reduce airway inflammation. - Instructed to use albuterol  inhaler every 4-6 hours as needed for symptom relief. - Ordered chest x-ray to rule out bronchial wall thickening or other abnormalities. - Refilled promethazine  cough syrup with instructions to increase dose to 5-10 mL as needed.  Exercise-induced asthma Diagnosed with exercise-induced asthma, rarely requiring inhaler use outside of physical exertion. No current wheezing or significant respiratory distress. - Continue use of albuterol  inhaler as needed for asthma symptoms.        Meds ordered this encounter  Medications   methylPREDNISolone  (MEDROL  DOSEPAK) 4 MG TBPK tablet    Sig: Take package as directed.    Dispense:  21 each    Refill:  0   promethazine -dextromethorphan (PROMETHAZINE -DM) 6.25-15 MG/5ML syrup    Sig: Take 5-10 mLs by mouth 4 (four) times daily as needed.    Dispense:  240 mL    Refill:  0    No follow-ups on file.  Heron CHRISTELLA Sharper, MD

## 2024-12-12 ENCOUNTER — Encounter: Payer: Self-pay | Admitting: Family Medicine

## 2024-12-12 DIAGNOSIS — F902 Attention-deficit hyperactivity disorder, combined type: Secondary | ICD-10-CM

## 2024-12-14 MED ORDER — BUPROPION HCL ER (SR) 150 MG PO TB12: 150.0000 mg | ORAL_TABLET | Freq: Every day | ORAL | 1 refills | Status: AC
